# Patient Record
Sex: Female | Born: 1955 | Race: Black or African American | Hispanic: No | Marital: Married | Smoking: Never smoker
Health system: Southern US, Community
[De-identification: ages and names within clinical notes are randomized; demographics above are authoritative.]

## PROBLEM LIST (undated history)

## (undated) DIAGNOSIS — M1990 Unspecified osteoarthritis, unspecified site: Secondary | ICD-10-CM

## (undated) DIAGNOSIS — M8580 Other specified disorders of bone density and structure, unspecified site: Secondary | ICD-10-CM

## (undated) DIAGNOSIS — I10 Essential (primary) hypertension: Secondary | ICD-10-CM

## (undated) HISTORY — PX: VAGINAL HYSTERECTOMY: SUR661

## (undated) HISTORY — PX: ECTOPIC PREGNANCY SURGERY: SHX613

## (undated) HISTORY — DX: Essential (primary) hypertension: I10

## (undated) HISTORY — PX: TUBAL LIGATION: SHX77

## (undated) HISTORY — DX: Other specified disorders of bone density and structure, unspecified site: M85.80

## (undated) HISTORY — PX: TOTAL HIP ARTHROPLASTY: SHX124

---

## 1997-10-15 ENCOUNTER — Observation Stay (HOSPITAL_COMMUNITY): Admission: RE | Admit: 1997-10-15 | Discharge: 1997-10-16 | Payer: Self-pay | Admitting: Gynecology

## 2001-10-03 ENCOUNTER — Encounter: Admission: RE | Admit: 2001-10-03 | Discharge: 2001-10-03 | Payer: Self-pay | Admitting: Otolaryngology

## 2001-10-03 ENCOUNTER — Encounter: Payer: Self-pay | Admitting: Otolaryngology

## 2003-12-23 ENCOUNTER — Other Ambulatory Visit: Admission: RE | Admit: 2003-12-23 | Discharge: 2003-12-23 | Payer: Self-pay | Admitting: Gynecology

## 2004-12-31 ENCOUNTER — Other Ambulatory Visit: Admission: RE | Admit: 2004-12-31 | Discharge: 2004-12-31 | Payer: Self-pay | Admitting: Gynecology

## 2006-02-01 ENCOUNTER — Other Ambulatory Visit: Admission: RE | Admit: 2006-02-01 | Discharge: 2006-02-01 | Payer: Self-pay | Admitting: Gynecology

## 2007-02-07 ENCOUNTER — Other Ambulatory Visit: Admission: RE | Admit: 2007-02-07 | Discharge: 2007-02-07 | Payer: Self-pay | Admitting: Gynecology

## 2007-08-13 ENCOUNTER — Encounter: Admission: RE | Admit: 2007-08-13 | Discharge: 2007-08-13 | Payer: Self-pay | Admitting: Gynecology

## 2008-01-21 ENCOUNTER — Ambulatory Visit: Payer: Self-pay | Admitting: Gynecology

## 2008-03-14 ENCOUNTER — Ambulatory Visit: Payer: Self-pay | Admitting: Obstetrics and Gynecology

## 2008-03-14 ENCOUNTER — Other Ambulatory Visit: Admission: RE | Admit: 2008-03-14 | Discharge: 2008-03-14 | Payer: Self-pay | Admitting: Obstetrics and Gynecology

## 2008-03-14 ENCOUNTER — Encounter: Payer: Self-pay | Admitting: Obstetrics and Gynecology

## 2008-08-21 ENCOUNTER — Ambulatory Visit: Payer: Self-pay | Admitting: Gynecology

## 2008-12-11 ENCOUNTER — Ambulatory Visit: Payer: Self-pay | Admitting: Obstetrics and Gynecology

## 2009-03-16 ENCOUNTER — Encounter: Payer: Self-pay | Admitting: Obstetrics and Gynecology

## 2009-03-16 ENCOUNTER — Ambulatory Visit: Payer: Self-pay | Admitting: Obstetrics and Gynecology

## 2009-03-16 ENCOUNTER — Other Ambulatory Visit: Admission: RE | Admit: 2009-03-16 | Discharge: 2009-03-16 | Payer: Self-pay | Admitting: Obstetrics and Gynecology

## 2009-07-13 ENCOUNTER — Ambulatory Visit: Payer: Self-pay | Admitting: Obstetrics and Gynecology

## 2010-01-18 ENCOUNTER — Ambulatory Visit: Payer: Self-pay | Admitting: Gynecology

## 2010-07-07 ENCOUNTER — Encounter: Payer: Self-pay | Admitting: Obstetrics and Gynecology

## 2010-07-08 ENCOUNTER — Other Ambulatory Visit (HOSPITAL_COMMUNITY)
Admission: RE | Admit: 2010-07-08 | Discharge: 2010-07-08 | Disposition: A | Discharge status: Home / Self Care | Payer: BC Managed Care – PPO | Source: Ambulatory Visit | Attending: Obstetrics and Gynecology | Admitting: Obstetrics and Gynecology

## 2010-07-08 ENCOUNTER — Encounter (INDEPENDENT_AMBULATORY_CARE_PROVIDER_SITE_OTHER): Payer: BC Managed Care – PPO | Admitting: Obstetrics and Gynecology

## 2010-07-08 ENCOUNTER — Other Ambulatory Visit: Payer: Self-pay | Admitting: Obstetrics and Gynecology

## 2010-07-08 DIAGNOSIS — Z01419 Encounter for gynecological examination (general) (routine) without abnormal findings: Secondary | ICD-10-CM

## 2010-07-08 DIAGNOSIS — Z124 Encounter for screening for malignant neoplasm of cervix: Secondary | ICD-10-CM | POA: Insufficient documentation

## 2010-09-27 ENCOUNTER — Ambulatory Visit (HOSPITAL_COMMUNITY)
Admission: RE | Admit: 2010-09-27 | Discharge: 2010-09-27 | Disposition: A | Discharge status: Home / Self Care | Payer: BC Managed Care – PPO | Source: Ambulatory Visit | Attending: Gynecology | Admitting: Gynecology

## 2010-09-27 ENCOUNTER — Ambulatory Visit (INDEPENDENT_AMBULATORY_CARE_PROVIDER_SITE_OTHER): Payer: BC Managed Care – PPO | Admitting: Gynecology

## 2010-09-27 DIAGNOSIS — R350 Frequency of micturition: Secondary | ICD-10-CM

## 2010-09-27 DIAGNOSIS — R82998 Other abnormal findings in urine: Secondary | ICD-10-CM

## 2010-09-27 DIAGNOSIS — R072 Precordial pain: Secondary | ICD-10-CM | POA: Insufficient documentation

## 2010-09-27 DIAGNOSIS — I10 Essential (primary) hypertension: Secondary | ICD-10-CM

## 2010-09-27 DIAGNOSIS — R51 Headache: Secondary | ICD-10-CM

## 2010-10-07 ENCOUNTER — Ambulatory Visit (INDEPENDENT_AMBULATORY_CARE_PROVIDER_SITE_OTHER): Payer: BC Managed Care – PPO | Admitting: Obstetrics and Gynecology

## 2010-10-07 DIAGNOSIS — B3731 Acute candidiasis of vulva and vagina: Secondary | ICD-10-CM

## 2010-10-07 DIAGNOSIS — B373 Candidiasis of vulva and vagina: Secondary | ICD-10-CM

## 2010-10-07 DIAGNOSIS — N390 Urinary tract infection, site not specified: Secondary | ICD-10-CM

## 2011-04-27 ENCOUNTER — Other Ambulatory Visit: Payer: Self-pay | Admitting: *Deleted

## 2011-04-27 MED ORDER — NONFORMULARY OR COMPOUNDED ITEM: Status: DC

## 2011-04-27 NOTE — Telephone Encounter (Signed)
rx sent

## 2011-09-02 ENCOUNTER — Other Ambulatory Visit: Payer: Self-pay | Admitting: Obstetrics and Gynecology

## 2012-01-16 ENCOUNTER — Encounter: Payer: Self-pay | Admitting: Obstetrics and Gynecology

## 2012-03-13 ENCOUNTER — Other Ambulatory Visit: Payer: Self-pay | Admitting: Obstetrics and Gynecology

## 2012-04-09 ENCOUNTER — Ambulatory Visit (INDEPENDENT_AMBULATORY_CARE_PROVIDER_SITE_OTHER): Payer: BC Managed Care – PPO | Admitting: Gynecology

## 2012-04-09 ENCOUNTER — Encounter: Payer: Self-pay | Admitting: Gynecology

## 2012-04-09 VITALS — BP 142/88

## 2012-04-09 DIAGNOSIS — N760 Acute vaginitis: Secondary | ICD-10-CM

## 2012-04-09 DIAGNOSIS — B9689 Other specified bacterial agents as the cause of diseases classified elsewhere: Secondary | ICD-10-CM

## 2012-04-09 DIAGNOSIS — R300 Dysuria: Secondary | ICD-10-CM

## 2012-04-09 DIAGNOSIS — A499 Bacterial infection, unspecified: Secondary | ICD-10-CM

## 2012-04-09 DIAGNOSIS — L293 Anogenital pruritus, unspecified: Secondary | ICD-10-CM

## 2012-04-09 DIAGNOSIS — N898 Other specified noninflammatory disorders of vagina: Secondary | ICD-10-CM

## 2012-04-09 LAB — URINALYSIS W MICROSCOPIC + REFLEX CULTURE
Bilirubin Urine: NEGATIVE
Glucose, UA: NEGATIVE mg/dL
Specific Gravity, Urine: 1.025 (ref 1.005–1.030)
Urobilinogen, UA: 0.2 mg/dL (ref 0.0–1.0)
pH: 6 (ref 5.0–8.0)

## 2012-04-09 MED ORDER — METRONIDAZOLE 0.75 % VA GEL: 1.0000 | Freq: Two times a day (BID) | VAGINAL | Status: DC

## 2012-04-09 MED ORDER — FLUCONAZOLE 100 MG PO TABS: 100.0000 mg | ORAL_TABLET | Freq: Every day | ORAL | Status: DC

## 2012-04-09 NOTE — Progress Notes (Signed)
Patient presented to the office today complaining of vaginal pruritus and some slight discomfort after the termination of her urination but no true dysuria. Patient with prior history transvaginal hysterectomy. Patient stated in June of this year she had a right hip replacement in the next few weeks she is going to have a left hip for placement. Because of the prosthesis she was also informed to take antibiotics before the procedure. She recently had dental work had taken antibiotics and she attributes which she thought was a yeast infection and had tried Diflucan that she had at home as well as  a 1 day treatment with Monistat vaginal cream with some resolution.  Exam: Bartholin urethra Skene was within normal limits Vagina white fishy odor discharge was noted vaginal cuff intact and Bimanual exam: Not done Rectal exam: Not done  Urinalysis negative Wet prep: A few yeast, moderate clue cells and many white blood cells and too numerous to count bacteria.  Assessment/plan; monilial vulvovaginitis and bacterial vaginosis,  the patient will be prescribed  MetroGel to apply twice a day for the next 5 days. Diflucan 150 mg one today and a repeat in 48 hours.

## 2012-04-09 NOTE — Patient Instructions (Signed)
Bacterial Vaginosis Bacterial vaginosis (BV) is a vaginal infection where the normal balance of bacteria in the vagina is disrupted. The normal balance is then replaced by an overgrowth of certain bacteria. There are several different kinds of bacteria that can cause BV. BV is the most common vaginal infection in women of childbearing age. CAUSES   The cause of BV is not fully understood. BV develops when there is an increase or imbalance of harmful bacteria.  Some activities or behaviors can upset the normal balance of bacteria in the vagina and put women at increased risk including:  Having a new sex partner or multiple sex partners.  Douching.  Using an intrauterine device (IUD) for contraception.  It is not clear what role sexual activity plays in the development of BV. However, women that have never had sexual intercourse are rarely infected with BV. Women do not get BV from toilet seats, bedding, swimming pools or from touching objects around them.  SYMPTOMS   Grey vaginal discharge.  A fish-like odor with discharge, especially after sexual intercourse.  Itching or burning of the vagina and vulva.  Burning or pain with urination.  Some women have no signs or symptoms at all. DIAGNOSIS  Your caregiver must examine the vagina for signs of BV. Your caregiver will perform lab tests and look at the sample of vaginal fluid through a microscope. They will look for bacteria and abnormal cells (clue cells), a pH test higher than 4.5, and a positive amine test all associated with BV.  RISKS AND COMPLICATIONS   Pelvic inflammatory disease (PID).  Infections following gynecology surgery.  Developing HIV.  Developing herpes virus. TREATMENT  Sometimes BV will clear up without treatment. However, all women with symptoms of BV should be treated to avoid complications, especially if gynecology surgery is planned. Female partners generally do not need to be treated. However, BV may spread  between female sex partners so treatment is helpful in preventing a recurrence of BV.   BV may be treated with antibiotics. The antibiotics come in either pill or vaginal cream forms. Either can be used with nonpregnant or pregnant women, but the recommended dosages differ. These antibiotics are not harmful to the baby.  BV can recur after treatment. If this happens, a second round of antibiotics will often be prescribed.  Treatment is important for pregnant women. If not treated, BV can cause a premature delivery, especially for a pregnant woman who had a premature birth in the past. All pregnant women who have symptoms of BV should be checked and treated.  For chronic reoccurrence of BV, treatment with a type of prescribed gel vaginally twice a week is helpful. HOME CARE INSTRUCTIONS   Finish all medication as directed by your caregiver.  Do not have sex until treatment is completed.  Tell your sexual partner that you have a vaginal infection. They should see their caregiver and be treated if they have problems, such as a mild rash or itching.  Practice safe sex. Use condoms. Only have 1 sex partner. PREVENTION  Basic prevention steps can help reduce the risk of upsetting the natural balance of bacteria in the vagina and developing BV:  Do not have sexual intercourse (be abstinent).  Do not douche.  Use all of the medicine prescribed for treatment of BV, even if the signs and symptoms go away.  Tell your sex partner if you have BV. That way, they can be treated, if needed, to prevent reoccurrence. SEEK MEDICAL CARE IF:     Your symptoms are not improving after 3 days of treatment.  You have increased discharge, pain, or fever. MAKE SURE YOU:   Understand these instructions.  Will watch your condition.  Will get help right away if you are not doing well or get worse. FOR MORE INFORMATION  Division of STD Prevention (DSTDP), Centers for Disease Control and Prevention:  www.cdc.gov/std American Social Health Association (ASHA): www.ashastd.org  Document Released: 04/25/2005 Document Revised: 07/18/2011 Document Reviewed: 10/16/2008 ExitCare Patient Information 2013 [employer].  

## 2012-04-17 ENCOUNTER — Telehealth: Payer: Self-pay | Admitting: *Deleted

## 2012-04-17 NOTE — Telephone Encounter (Signed)
Please call in Flagyl 500 mg to take 1 tablet twice a day for 5 days #10. If the Bartholin cyst is causing her problems she needs to be seen in the office for evaluation and treatment.

## 2012-04-17 NOTE — Telephone Encounter (Signed)
Pt was given 04/09/12 given rx for BV infection MetroGel to apply twice a day for the next 5 days she didn't get to complete the medication. She developed a bartholin cyst outside her vagina and would like something oral to take by mouth. She only had 3 days left of the metrogel to take prior to the cyst. Please advise

## 2012-04-18 MED ORDER — METRONIDAZOLE 500 MG PO TABS: 500.0000 mg | ORAL_TABLET | Freq: Two times a day (BID) | ORAL | Status: DC

## 2012-04-18 NOTE — Telephone Encounter (Signed)
rx sent, pt informed with the below 

## 2012-04-23 ENCOUNTER — Encounter: Payer: Self-pay | Admitting: Obstetrics and Gynecology

## 2012-04-23 ENCOUNTER — Ambulatory Visit (INDEPENDENT_AMBULATORY_CARE_PROVIDER_SITE_OTHER): Payer: BC Managed Care – PPO | Admitting: Obstetrics and Gynecology

## 2012-04-23 VITALS — BP 124/76 | Ht 68.0 in | Wt 202.0 lb

## 2012-04-23 DIAGNOSIS — I10 Essential (primary) hypertension: Secondary | ICD-10-CM | POA: Insufficient documentation

## 2012-04-23 DIAGNOSIS — Z01419 Encounter for gynecological examination (general) (routine) without abnormal findings: Secondary | ICD-10-CM

## 2012-04-23 DIAGNOSIS — N898 Other specified noninflammatory disorders of vagina: Secondary | ICD-10-CM

## 2012-04-23 LAB — WET PREP FOR TRICH, YEAST, CLUE
Trich, Wet Prep: NONE SEEN
Yeast Wet Prep HPF POC: NONE SEEN

## 2012-04-23 MED ORDER — TERCONAZOLE 0.8 % VA CREA: 1.0000 | TOPICAL_CREAM | Freq: Every day | VAGINAL | Status: DC

## 2012-04-23 NOTE — Patient Instructions (Addendum)
Call Cancer center at [facility]. Get counseling for BRCA1 and BRCA2 because of sister and aunt's history. Schedule  bone density.

## 2012-04-23 NOTE — Progress Notes (Signed)
Patient came to see me today for her annual GYN exam. We are treating her with Depo- estradiol and testosterone cream for menopausal symptoms that did not respond to other regimens. She is doing well with both and would like to continue. She is having no vaginal bleeding. She is having no pelvic pain. She had a vaginal hysterectomy for benign disease. In her early 98s she had cervical dysplasia treated with cryosurgery. She has had normal Pap smears since then.Her last Pap smear was 2012. She is up-to-date on mammograms.She has never had a bone density. She does her lab through her PCP. She is noticing a persistent cottage cheesy discharge with itching. She is required several rounds of antibiotics. She is for Dr. Lily Peer last month and was treated for BV and yeast with MetroGel and Diflucan. She repeated Diflucan but is still symptomatic. She also had a labial cyst rupture on her right labia and is draining purulent material. It is much better. Both sister and maternal aunt had breast cancer at age 67. We have discussed BRCA1 and BRCA2 testing last year but the patient has not followed through. HEENT: Within normal limits.Kennon Portela present Neck: No masses. Supraclavicular lymph nodes: Not enlarged. Breasts: Examined in both sitting and lying position. Symmetrical without skin changes or masses. Abdomen: Soft no masses guarding or rebound. No hernias. Pelvic: External Shows area of folliculitis on right labia almost completely resolved and without cellulitis. BUS within normal limits. Vaginal examination shows good estrogen effect, no cystocele enterocele or rectocele.Wet prep is negative for yeast and Trichomonas but patient has a yeast like discharge. Cervix and uterus absent. Adnexa within normal limits. Rectal-vaginal exam is confirmatory  Assessment: #1. Menopausal symptoms #2. Resolving  folliculitis #3. Yeast vaginitis #4. Family history of early onset breast cancer  Plan: Continued  depo-estradiol (1mg /cc)-inject 1 cc monthly. Testosterone cream 2%-continue when necessary. Mammogram yearly. Schedule bone density. Pap not done.The new Pap smear guidelines were discussed with the patient. Patient to get genetic testing at cancer Center for BRCA1 and BRCA2. Terconazole 3 cream for yeast vaginitis. Call if folliculitis does not completely heal-continue warm soaks.

## 2012-05-08 ENCOUNTER — Other Ambulatory Visit: Payer: Self-pay | Admitting: Gynecology

## 2012-05-08 NOTE — Telephone Encounter (Signed)
Pt saw you Dr Reece Agar for her annual exam 04/23/12 and was treated with terazol 3.

## 2013-04-30 ENCOUNTER — Ambulatory Visit (INDEPENDENT_AMBULATORY_CARE_PROVIDER_SITE_OTHER): Payer: BC Managed Care – PPO | Admitting: Women's Health

## 2013-04-30 DIAGNOSIS — B3731 Acute candidiasis of vulva and vagina: Secondary | ICD-10-CM

## 2013-04-30 DIAGNOSIS — B373 Candidiasis of vulva and vagina: Secondary | ICD-10-CM

## 2013-04-30 DIAGNOSIS — R3915 Urgency of urination: Secondary | ICD-10-CM

## 2013-04-30 LAB — WET PREP FOR TRICH, YEAST, CLUE

## 2013-04-30 LAB — URINALYSIS, ROUTINE W REFLEX MICROSCOPIC
Hgb urine dipstick: NEGATIVE
Leukocytes, UA: NEGATIVE
Nitrite: NEGATIVE
Protein, ur: NEGATIVE mg/dL

## 2013-04-30 MED ORDER — FLUCONAZOLE 100 MG PO TABS: ORAL_TABLET | ORAL | Status: DC

## 2013-04-30 NOTE — Addendum Note (Signed)
Addended by: Bertram Savin A on: 04/30/2013 02:57 PM   Modules accepted: Orders

## 2013-04-30 NOTE — Progress Notes (Signed)
Patient ID: Deborah Hampton, female   DOB: 04/03/1956, 57 y.o.   MRN: [id number] Presents with vaginal discharge with mild itching, occasional urinary urgency. Denies any pain or burning with urination. Same partner. Hysterectomy. Reports Diflucan works best but needs to take longer than once.  Exam: Appears well. External genitalia mild erythema. Speculum exam scant white discharge wet prep positive for yeast. Bimanual no  tenderness. UA: Trace blood, 0 - 2 RBCs, no bacteria.  Yeast vaginitis  Plan: Diflucan 100 2 tablets today and then 1 tablet weekly for 3 weeks. Prescription, proper use given and reviewed. Instructed to call if no relief of symptoms.

## 2013-09-13 ENCOUNTER — Encounter: Payer: Self-pay | Admitting: Women's Health

## 2013-09-13 ENCOUNTER — Ambulatory Visit (INDEPENDENT_AMBULATORY_CARE_PROVIDER_SITE_OTHER): Payer: BC Managed Care – PPO | Admitting: Women's Health

## 2013-09-13 VITALS — BP 110/76 | Ht 68.0 in | Wt 207.0 lb

## 2013-09-13 DIAGNOSIS — Z01419 Encounter for gynecological examination (general) (routine) without abnormal findings: Secondary | ICD-10-CM

## 2013-09-13 NOTE — Patient Instructions (Signed)
Health Recommendations for Postmenopausal Women Respected and ongoing research has looked at the most common causes of death, disability, and poor quality of life in postmenopausal women. The causes include heart disease, diseases of blood vessels, diabetes, depression, cancer, and bone loss (osteoporosis). Many things can be done to help lower the chances of developing these and other common problems: CARDIOVASCULAR DISEASE Heart Disease: A heart attack is a medical emergency. Know the signs and symptoms of a heart attack. Below are things women can do to reduce their risk for heart disease.   Do not smoke. If you smoke, quit.  Aim for a healthy weight. Being overweight causes many preventable deaths. Eat a healthy and balanced diet and drink an adequate amount of liquids.  Get moving. Make a commitment to be more physically active. Aim for 30 minutes of activity on most, if not all days of the week.  Eat for heart health. Choose a diet that is low in saturated fat and cholesterol and eliminate trans fat. Include whole grains, vegetables, and fruits. Read and understand the labels on food containers before buying.  Know your numbers. Ask your caregiver to check your blood pressure, cholesterol (total, HDL, LDL, triglycerides) and blood glucose. Work with your caregiver on improving your entire clinical picture.  High blood pressure. Limit or stop your table salt intake (try salt substitute and food seasonings). Avoid salty foods and drinks. Read labels on food containers before buying. Eating well and exercising can help control high blood pressure. STROKE  Stroke is a medical emergency. Stroke may be the result of a blood clot in a blood vessel in the brain or by a brain hemorrhage (bleeding). Know the signs and symptoms of a stroke. To lower the risk of developing a stroke:  Avoid fatty foods.  Quit smoking.  Control your diabetes, blood pressure, and irregular heart rate. THROMBOPHLEBITIS  (BLOOD CLOT) OF THE LEG  Becoming overweight and leading a stationary lifestyle may also contribute to developing blood clots. Controlling your diet and exercising will help lower the risk of developing blood clots. CANCER SCREENING  Breast Cancer: Take steps to reduce your risk of breast cancer.  You should practice "breast self-awareness." This means understanding the normal appearance and feel of your breasts and should include breast self-examination. Any changes detected, no matter how small, should be reported to your caregiver.  After age 40, you should have a clinical breast exam (CBE) every year.  Starting at age 40, you should consider having a mammogram (breast X-ray) every year.  If you have a family history of breast cancer, talk to your caregiver about genetic screening.  If you are at high risk for breast cancer, talk to your caregiver about having an MRI and a mammogram every year.  Intestinal or Stomach Cancer: Tests to consider are a rectal exam, fecal occult blood, sigmoidoscopy, and colonoscopy. Women who are high risk may need to be screened at an earlier age and more often.  Cervical Cancer:  Beginning at age 30, you should have a Pap test every 3 years as long as the past 3 Pap tests have been normal.  If you have had past treatment for cervical cancer or a condition that could lead to cancer, you need Pap tests and screening for cancer for at least 20 years after your treatment.  If you had a hysterectomy for a problem that was not cancer or a condition that could lead to cancer, then you no longer need Pap tests.    If you are between ages 65 and 70, and you have had normal Pap tests going back 10 years, you no longer need Pap tests.  If Pap tests have been discontinued, risk factors (such as a new sexual partner) need to be reassessed to determine if screening should be resumed.  Some medical problems can increase the chance of getting cervical cancer. In these  cases, your caregiver may recommend more frequent screening and Pap tests.  Uterine Cancer: If you have vaginal bleeding after reaching menopause, you should notify your caregiver.  Ovarian cancer: Other than yearly pelvic exams, there are no reliable tests available to screen for ovarian cancer at this time except for yearly pelvic exams.  Lung Cancer: Yearly chest X-rays can detect lung cancer and should be done on high risk women, such as cigarette smokers and women with chronic lung disease (emphysema).  Skin Cancer: A complete body skin exam should be done at your yearly examination. Avoid overexposure to the sun and ultraviolet light lamps. Use a strong sun block cream when in the sun. All of these things are important in lowering the risk of skin cancer. MENOPAUSE Menopause Symptoms: Hormone therapy products are effective for treating symptoms associated with menopause:  Moderate to severe hot flashes.  Night sweats.  Mood swings.  Headaches.  Tiredness.  Loss of sex drive.  Insomnia.  Other symptoms. Hormone replacement carries certain risks, especially in older women. Women who use or are thinking about using estrogen or estrogen with progestin treatments should discuss that with their caregiver. Your caregiver will help you understand the benefits and risks. The ideal dose of hormone replacement therapy is not known. The Food and Drug Administration (FDA) has concluded that hormone therapy should be used only at the lowest doses and for the shortest amount of time to reach treatment goals.  OSTEOPOROSIS Protecting Against Bone Loss and Preventing Fracture: If you use hormone therapy for prevention of bone loss (osteoporosis), the risks for bone loss must outweigh the risk of the therapy. Ask your caregiver about other medications known to be safe and effective for preventing bone loss and fractures. To guard against bone loss or fractures, the following is recommended:  If  you are less than age 50, take 1000 mg of calcium and at least 600 mg of Vitamin D per day.  If you are greater than age 50 but less than age 70, take 1200 mg of calcium and at least 600 mg of Vitamin D per day.  If you are greater than age 70, take 1200 mg of calcium and at least 800 mg of Vitamin D per day. Smoking and excessive alcohol intake increases the risk of osteoporosis. Eat foods rich in calcium and vitamin D and do weight bearing exercises several times a week as your caregiver suggests. DIABETES Diabetes Melitus: If you have Type I or Type 2 diabetes, you should keep your blood sugar under control with diet, exercise and recommended medication. Avoid too many sweets, starchy and fatty foods. Being overweight can make control more difficult. COGNITION AND MEMORY Cognition and Memory: Menopausal hormone therapy is not recommended for the prevention of cognitive disorders such as Alzheimer's disease or memory loss.  DEPRESSION  Depression may occur at any age, but is common in elderly women. The reasons may be because of physical, medical, social (loneliness), or financial problems and needs. If you are experiencing depression because of medical problems and control of symptoms, talk to your caregiver about this. Physical activity and   exercise may help with mood and sleep. Community and volunteer involvement may help your sense of value and worth. If you have depression and you feel that the problem is getting worse or becoming severe, talk to your caregiver about treatment options that are best for you. ACCIDENTS  Accidents are common and can be serious in the elderly woman. Prepare your house to prevent accidents. Eliminate throw rugs, place hand bars in the bath, shower and toilet areas. Avoid wearing high heeled shoes or walking on wet, snowy, and icy areas. Limit or stop driving if you have vision or hearing problems, or you feel you are unsteady with you movements and  reflexes. HEPATITIS C Hepatitis C is a type of viral infection affecting the liver. It is spread mainly through contact with blood from an infected person. It can be treated, but if left untreated, it can lead to severe liver damage over years. Many people who are infected do not know that the virus is in their blood. If you are a "baby-boomer", it is recommended that you have one screening test for Hepatitis C. IMMUNIZATIONS  Several immunizations are important to consider having during your senior years, including:   Tetanus, diptheria, and pertussis booster shot.  Influenza every year before the flu season begins.  Pneumonia vaccine.  Shingles vaccine.  Others as indicated based on your specific needs. Talk to your caregiver about these. Document Released: 06/17/2005 Document Revised: 04/11/2012 Document Reviewed: 02/11/2008 ExitCare Patient Information 2014 [employer].  

## 2013-09-13 NOTE — Progress Notes (Signed)
Deborah Hampton 1955-09-06 [id number]    History:    Presents for annual exam.  TVH for endometriosis, testosterone cream occasionally. Hypertension managed by primary care. History of abnormal Pap in her 11s with cryo-  normal Paps after. Normal mammogram history. Sister died from breast cancer BRCA status unknown, has not had BRCA testing. 2012 negative colonoscopy. Has not had a DEXA.  Past medical history, past surgical history, family history and social history were all reviewed and documented in the EPIC chart. Self-employed and rentals. Mother, father, sister hypertension, father diabetes. Children are 19 and 28 both doing well.  ROS:  A  12 point ROS was performed and pertinent positives and negatives are included.  Exam:  Filed Vitals:   09/13/13 1024  BP: 110/76    General appearance:  Normal Thyroid:  Symmetrical, normal in size, without palpable masses or nodularity. Respiratory  Auscultation:  Clear without wheezing or rhonchi Cardiovascular  Auscultation:  Regular rate, without rubs, murmurs or gallops  Edema/varicosities:  Not grossly evident Abdominal  Soft,nontender, without masses, guarding or rebound.  Liver/spleen:  No organomegaly noted  Hernia:  None appreciated  Skin  Inspection:  Grossly normal   Breasts: Examined lying and sitting.     Right: Without masses, retractions, discharge or axillary adenopathy.     Left: Without masses, retractions, discharge or axillary adenopathy. Gentitourinary   Inguinal/mons:  Normal without inguinal adenopathy  External genitalia:  Normal  BUS/Urethra/Skene's glands:  Normal  Vagina:  Normal  Cervix: Absent  Uterus:  Absent  Adnexa/parametria:     Rt: Without masses or tenderness.   Lt: Without masses or tenderness.  Anus and perineum: Normal  Digital rectal exam: Normal sphincter tone without palpated masses or tenderness  Assessment/Plan:  58 y.o. WBF G2P2  for annual exam.     TVH for  endometriosis Testosterone cream Hypertension primary care manages labs and meds  Plan: SBE's, continue annual mammogram, BRCA testing reviewed, declines. Increase regular exercise, calcium rich diet, vitamin D 2000 daily encouraged. DEXA, encourage, states not covered with insurance. Reviewed importance of weight bearing exercises for diabetes prevention and then health. Testosterone 2% cream uses occasionally, less than weekly, prescription, proper use given and reviewed risks.   [place], 11:18 AM 09/13/2013

## 2013-10-17 ENCOUNTER — Ambulatory Visit (INDEPENDENT_AMBULATORY_CARE_PROVIDER_SITE_OTHER): Payer: BC Managed Care – PPO | Admitting: Women's Health

## 2013-10-17 ENCOUNTER — Encounter: Payer: Self-pay | Admitting: Women's Health

## 2013-10-17 DIAGNOSIS — N644 Mastodynia: Secondary | ICD-10-CM

## 2013-10-17 NOTE — Progress Notes (Signed)
Patient ID: Deborah Hampton, female   DOB: January 30, 1956, 58 y.o.   MRN: [id number]  Presents with complaint of itermittent left-sided breast pain for 3 days. Weight bearing exercise on Monday and Tuesday, notes pain increased following work-out. Reproducible with arm movement. Located along lateral aspect of breast and around to back. Pain is intermittent and has improved since Monday. Last mammogram 2015- normal. Sister age 21 breast cancer.  Exam: well appearing Breasts: Examined lying and sitting.  Right: Without masses, retractions, discharge or axillary adenopathy.  Left: Without masses, retractions, discharge or axillary adenopathy. Pain reproducible with palpation of lateral chest wall.   Mastodynia likely musculoskeletal  Plan: Ibuprofen and rest from heavy lifting recommended. Instructed to call if symptoms worsen or do not improve in 2 weeks, will proceed with ultrasound with diagnostic mammogram.

## 2014-01-24 ENCOUNTER — Ambulatory Visit (INDEPENDENT_AMBULATORY_CARE_PROVIDER_SITE_OTHER): Payer: BC Managed Care – PPO | Admitting: Women's Health

## 2014-01-24 ENCOUNTER — Encounter: Payer: Self-pay | Admitting: Women's Health

## 2014-01-24 DIAGNOSIS — N898 Other specified noninflammatory disorders of vagina: Secondary | ICD-10-CM

## 2014-01-24 DIAGNOSIS — B373 Candidiasis of vulva and vagina: Secondary | ICD-10-CM

## 2014-01-24 DIAGNOSIS — B3731 Acute candidiasis of vulva and vagina: Secondary | ICD-10-CM

## 2014-01-24 LAB — WET PREP FOR TRICH, YEAST, CLUE
Clue Cells Wet Prep HPF POC: NONE SEEN
TRICH WET PREP: NONE SEEN
YEAST WET PREP: NONE SEEN

## 2014-01-24 MED ORDER — FLUCONAZOLE 100 MG PO TABS: ORAL_TABLET | ORAL | Status: DC

## 2014-01-24 NOTE — Progress Notes (Signed)
Presents with complaints of white, chunky vaginal discharge and itching.  Started approximately two weeks ago after being treated with erythromycin by PCP.  Took diflucan that was left over and treated with OTC monistat.  Symptoms diminished but became more severe 2 days ago.  Denies any odor, dysuria, abd pain, back pain, urinary symptoms, fever, nausea or vomiting.  History of recurrent yeast. TVH no HRT.  Exam: Well-appearing.  External genital exam:  WNL.  Speculum exam: thick, chunky white discharge present, mild vaginal wall erythema.  Wet Prep: WNL.   Clinical Yeast infection  Plan:  Diflucan 200 for 1 dose.  Repeat in 3 days if needed.  #6.  Return if symptoms do not resolve or new symptoms develop.

## 2014-01-24 NOTE — Patient Instructions (Signed)

## 2014-01-31 ENCOUNTER — Other Ambulatory Visit: Payer: Self-pay

## 2014-01-31 DIAGNOSIS — Z1231 Encounter for screening mammogram for malignant neoplasm of breast: Secondary | ICD-10-CM

## 2014-02-07 ENCOUNTER — Ambulatory Visit
Admission: RE | Admit: 2014-02-07 | Discharge: 2014-02-07 | Disposition: A | Discharge status: Home / Self Care | Payer: BC Managed Care – PPO | Source: Ambulatory Visit

## 2014-02-07 DIAGNOSIS — Z1231 Encounter for screening mammogram for malignant neoplasm of breast: Secondary | ICD-10-CM

## 2014-03-10 ENCOUNTER — Encounter: Payer: Self-pay | Admitting: Women's Health

## 2014-07-21 ENCOUNTER — Telehealth: Payer: Self-pay | Admitting: *Deleted

## 2014-07-21 ENCOUNTER — Other Ambulatory Visit: Payer: Self-pay

## 2014-07-21 MED ORDER — FLUCONAZOLE 100 MG PO TABS: ORAL_TABLET | ORAL | Status: DC

## 2014-07-21 NOTE — Telephone Encounter (Signed)
Okay, diflucan 100, 2 tablets today and repeat if needed in 3 days, # 4  hx of recurrent yeast.

## 2014-07-21 NOTE — Telephone Encounter (Signed)
Pt informed, Rx sent. 

## 2014-07-21 NOTE — Telephone Encounter (Signed)
Pt called c/o yeast infection due to having dental work done about 1 week ago. Pt asked if you would be willing to refill Rx? Please advise

## 2014-07-28 ENCOUNTER — Encounter: Payer: Self-pay | Admitting: Gynecology

## 2014-07-28 ENCOUNTER — Ambulatory Visit (INDEPENDENT_AMBULATORY_CARE_PROVIDER_SITE_OTHER): Payer: BLUE CROSS/BLUE SHIELD | Admitting: Gynecology

## 2014-07-28 VITALS — BP 124/76

## 2014-07-28 DIAGNOSIS — N898 Other specified noninflammatory disorders of vagina: Secondary | ICD-10-CM | POA: Diagnosis not present

## 2014-07-28 DIAGNOSIS — N760 Acute vaginitis: Secondary | ICD-10-CM | POA: Diagnosis not present

## 2014-07-28 DIAGNOSIS — A499 Bacterial infection, unspecified: Secondary | ICD-10-CM | POA: Diagnosis not present

## 2014-07-28 DIAGNOSIS — B9689 Other specified bacterial agents as the cause of diseases classified elsewhere: Secondary | ICD-10-CM

## 2014-07-28 LAB — WET PREP FOR TRICH, YEAST, CLUE
Trich, Wet Prep: NONE SEEN
YEAST WET PREP: NONE SEEN

## 2014-07-28 MED ORDER — METRONIDAZOLE 500 MG PO TABS: 500.0000 mg | ORAL_TABLET | Freq: Two times a day (BID) | ORAL | Status: DC

## 2014-07-28 NOTE — Progress Notes (Signed)
Deborah Hampton 03-26-1956 [id number]        59 y.o.  [id number] With one half weeks of vaginal discharge and irritation. Followed antibiotics. Called and was treated with Diflucan 1 after Monistat OTC failed. Symptoms have persisted despite this treatment. No vaginal odor. No urinary symptoms such as frequency dysuria or urgency. No low back pain, fever chills nausea vomiting diarrhea constipation.  Past medical history,surgical history, problem list, medications, allergies, family history and social history were all reviewed and documented in the EPIC chart.  Directed ROS with pertinent positives and negatives documented in the history of present illness/assessment and plan.  Exam: Kim assistant Filed Vitals:   07/28/14 1006  BP: 124/76   General appearance:  Normal Abdomen soft nontender without masses guarding rebound Pelvic external BUS vagina with slight white discharge. Bimanual without masses or tenderness.  Assessment/Plan:  59 y.o. [id number] with above symptoms and exam. Wet prep does show a few clue cells. No yeast or amine. Will cover his low-grade bacterial vaginosis with Flagyl 500 mg twice a day 7days, alcohol avoidance reviewed. Follow up if symptoms persist, worsen or recur.    Dara LordsFONTAINE,Elfida Shimada P MD, 10:29 AM 07/28/2014

## 2014-07-28 NOTE — Patient Instructions (Signed)
Take antibiotic pill twice daily for 7 days. Avoid alcohol while taking.  Call if symptoms persist, worsen or recur.  Bacterial Vaginosis Bacterial vaginosis is a vaginal infection that occurs when the normal balance of bacteria in the vagina is disrupted. It results from an overgrowth of certain bacteria. This is the most common vaginal infection in women of childbearing age. Treatment is important to prevent complications, especially in pregnant women, as it can cause a premature delivery. CAUSES  Bacterial vaginosis is caused by an increase in harmful bacteria that are normally present in smaller amounts in the vagina. Several different kinds of bacteria can cause bacterial vaginosis. However, the reason that the condition develops is not fully understood. RISK FACTORS Certain activities or behaviors can put you at an increased risk of developing bacterial vaginosis, including:  Having a new sex partner or multiple sex partners.  Douching.  Using an intrauterine device (IUD) for contraception. Women do not get bacterial vaginosis from toilet seats, bedding, swimming pools, or contact with objects around them. SIGNS AND SYMPTOMS  Some women with bacterial vaginosis have no signs or symptoms. Common symptoms include:  Grey vaginal discharge.  A fishlike odor with discharge, especially after sexual intercourse.  Itching or burning of the vagina and vulva.  Burning or pain with urination. DIAGNOSIS  Your health care provider will take a medical history and examine the vagina for signs of bacterial vaginosis. A sample of vaginal fluid may be taken. Your health care provider will look at this sample under a microscope to check for bacteria and abnormal cells. A vaginal pH test may also be done.  TREATMENT  Bacterial vaginosis may be treated with antibiotic medicines. These may be given in the form of a pill or a vaginal cream. A second round of antibiotics may be prescribed if the  condition comes back after treatment.  HOME CARE INSTRUCTIONS   Only take over-the-counter or prescription medicines as directed by your health care provider.  If antibiotic medicine was prescribed, take it as directed. Make sure you finish it even if you start to feel better.  Do not have sex until treatment is completed.  Tell all sexual partners that you have a vaginal infection. They should see their health care provider and be treated if they have problems, such as a mild rash or itching.  Practice safe sex by using condoms and only having one sex partner. SEEK MEDICAL CARE IF:   Your symptoms are not improving after 3 days of treatment.  You have increased discharge or pain.  You have a fever. MAKE SURE YOU:   Understand these instructions.  Will watch your condition.  Will get help right away if you are not doing well or get worse. FOR MORE INFORMATION  Centers for Disease Control and Prevention, Division of STD Prevention: SolutionApps.co.zawww.cdc.gov/std American Sexual Health Association (ASHA): www.ashastd.org  Document Released: 04/25/2005 Document Revised: 02/13/2013 Document Reviewed: 12/05/2012 [facility] Patient Information 2015 [employer] [place]. This information is not intended to replace advice given to you by your health care provider. Make sure you discuss any questions you have with your health care provider.

## 2014-07-29 ENCOUNTER — Ambulatory Visit: Payer: Self-pay | Admitting: Women's Health

## 2014-09-18 ENCOUNTER — Telehealth: Payer: Self-pay | Admitting: *Deleted

## 2014-09-18 ENCOUNTER — Other Ambulatory Visit: Payer: Self-pay | Admitting: Obstetrics and Gynecology

## 2014-09-18 NOTE — Telephone Encounter (Signed)
Pt called requesting Rx estradiol injectable 5mL, pt advised annual due and Rx last filled in 2013, pt transferred to front desk.

## 2014-10-08 ENCOUNTER — Encounter: Payer: Self-pay | Admitting: Women's Health

## 2014-10-08 ENCOUNTER — Other Ambulatory Visit: Payer: Self-pay | Admitting: Gynecology

## 2014-10-08 ENCOUNTER — Ambulatory Visit (INDEPENDENT_AMBULATORY_CARE_PROVIDER_SITE_OTHER): Payer: BLUE CROSS/BLUE SHIELD | Admitting: Women's Health

## 2014-10-08 VITALS — BP 118/80 | Wt 198.0 lb

## 2014-10-08 DIAGNOSIS — Z13820 Encounter for screening for osteoporosis: Secondary | ICD-10-CM

## 2014-10-08 DIAGNOSIS — Z01419 Encounter for gynecological examination (general) (routine) without abnormal findings: Secondary | ICD-10-CM | POA: Diagnosis not present

## 2014-10-08 DIAGNOSIS — N898 Other specified noninflammatory disorders of vagina: Secondary | ICD-10-CM | POA: Diagnosis not present

## 2014-10-08 DIAGNOSIS — B373 Candidiasis of vulva and vagina: Secondary | ICD-10-CM | POA: Diagnosis not present

## 2014-10-08 DIAGNOSIS — B3731 Acute candidiasis of vulva and vagina: Secondary | ICD-10-CM

## 2014-10-08 LAB — WET PREP FOR TRICH, YEAST, CLUE
Clue Cells Wet Prep HPF POC: NONE SEEN
Trich, Wet Prep: NONE SEEN

## 2014-10-08 MED ORDER — FLUCONAZOLE 150 MG PO TABS: 150.0000 mg | ORAL_TABLET | Freq: Once | ORAL | Status: DC

## 2014-10-08 NOTE — Patient Instructions (Signed)

## 2014-10-08 NOTE — Progress Notes (Signed)
Deborah Hampton 1955-09-25 [id number]    History:    Presents for annual exam.  TVH. Cryo-in her 6420s with normal Paps after. Normal mammogram history. Sister died from breast cancer. 2012 negative colonoscopy. Has not had a DEXA. Hypertension primary care manages labs and meds. Uses testosterone cream externally rarely. Recently had homeopathic estrogen pellets placed in buttock. Has lost 10 pounds in the past year with diet and exercise.  Past medical history, past surgical history, family history and social history were all reviewed and documented in the EPIC chart. Has a real estate business. Children ages 5620 and 3929 both doing well. Numerous family members with hypertension, father diabetes.  ROS:  A ROS was performed and pertinent positives and negatives are included.  Exam:  Filed Vitals:   10/08/14 0926  BP: 118/80    General appearance:  Normal Thyroid:  Symmetrical, normal in size, without palpable masses or nodularity. Respiratory  Auscultation:  Clear without wheezing or rhonchi Cardiovascular  Auscultation:  Regular rate, without rubs, murmurs or gallops  Edema/varicosities:  Not grossly evident Abdominal  Soft,nontender, without masses, guarding or rebound.  Liver/spleen:  No organomegaly noted  Hernia:  None appreciated  Skin  Inspection:  Grossly normal   Breasts: Examined lying and sitting.     Right: Without masses, retractions, discharge or axillary adenopathy.     Left: Without masses, retractions, discharge or axillary adenopathy. Gentitourinary   Inguinal/mons:  Normal without inguinal adenopathy  External genitalia:  Normal  BUS/Urethra/Skene's glands:  Normal  Vagina:  Normal  Cervix:  And uterus absent  Adnexa/parametria:     Rt: Without masses or tenderness.   Lt: Without masses or tenderness.  Anus and perineum: Normal  Digital rectal exam: Normal sphincter tone without palpated masses or tenderness  Assessment/Plan:  59 y.o.DBF G2P2  for  annual exam complaint of increased discharge without itching or odor.  Yeast vaginitis TVH using homeopathic estrogen pellets and rare use of external testosterone cream Hypertension primary care manages labs and meds  Plan: Testosterone 2% cream externally uses less than weekly prescription, proper use, risks reviewed. Reviewed best not to use estrogen since has been off for 3 years, risks of blood clots, strokes, breast cancer reviewed. Encouraged over-the-counter vitamin E, soy products. Reviewed anteverted presses, declines. SBE's, annual mammogram, continue 3-D tomography, exercise, calcium rich diet, vitamin D 1000 daily. Has not had a DEXA instructed to schedule. Testosterone 2% cream prescription, proper use given and reviewed slight risks uses rarely. UHarrington Challenger.    Deborah Hampton J WHNP, 10:03 AM 10/08/2014

## 2014-10-09 LAB — URINALYSIS W MICROSCOPIC + REFLEX CULTURE
Bilirubin Urine: NEGATIVE
CASTS: NONE SEEN
Glucose, UA: NEGATIVE mg/dL
Hgb urine dipstick: NEGATIVE
Ketones, ur: NEGATIVE mg/dL
LEUKOCYTES UA: NEGATIVE
NITRITE: NEGATIVE
Protein, ur: NEGATIVE mg/dL
SPECIFIC GRAVITY, URINE: 1.022 (ref 1.005–1.030)
Urobilinogen, UA: 0.2 mg/dL (ref 0.0–1.0)
pH: 5 (ref 5.0–8.0)

## 2014-10-10 LAB — URINE CULTURE

## 2015-02-02 ENCOUNTER — Other Ambulatory Visit: Payer: Self-pay

## 2015-02-02 ENCOUNTER — Ambulatory Visit (INDEPENDENT_AMBULATORY_CARE_PROVIDER_SITE_OTHER): Payer: BLUE CROSS/BLUE SHIELD | Admitting: Gynecology

## 2015-02-02 ENCOUNTER — Encounter: Payer: Self-pay | Admitting: Gynecology

## 2015-02-02 ENCOUNTER — Telehealth: Payer: Self-pay | Admitting: *Deleted

## 2015-02-02 VITALS — BP 124/78

## 2015-02-02 DIAGNOSIS — Z1231 Encounter for screening mammogram for malignant neoplasm of breast: Secondary | ICD-10-CM

## 2015-02-02 DIAGNOSIS — N644 Mastodynia: Secondary | ICD-10-CM | POA: Diagnosis not present

## 2015-02-02 NOTE — Patient Instructions (Signed)
Office will call you to arrange the mammogram and ultrasound of the breast. 

## 2015-02-02 NOTE — Progress Notes (Signed)
Deborah Hampton 1955-12-22 [id number]        59 y.o.  [id number] presents with one-week history of right breast tenderness and questioning whether she feels a lump. Was doing well until her 90 pound dog pulled her on the leash and she had strain/injury to her shoulder. It seems to radiate down into her breast and she was unsure whether she felt a mass or not. No other lumps or tenderness. No nipple discharge. Is due for her mammogram now.  Past medical history,surgical history, problem list, medications, allergies, family history and social history were all reviewed and documented in the EPIC chart.  Directed ROS with pertinent positives and negatives documented in the history of present illness/assessment and plan.  Exam: Kim assistant Filed Vitals:   02/02/15 1209  BP: 124/78   General appearance:  Normal Both breasts examined lying and sitting without masses retractions discharge adenopathy. The area the patient is pointing to is the 2:00 to 3:00 peripheral location along the sternal border. There are no palpable or visual abnormalities.  Assessment/Plan:  59 y.o. [id number] with history and exam as above. Will start with diagnostic mammography/ultrasound of this area. I suspect this is more musculoskeletal and will resolve with heat and pain medication. She is taking extra strength Tylenol now has ibuprofen upsets her stomach. If her shoulder pain continues of recommended follow up with orthopedics.  If her breast discomfort continues despite negative studies then she will follow up with me.    Dara Lords MD, 12:22 PM 02/02/2015

## 2015-02-02 NOTE — Telephone Encounter (Signed)
Orders placed at breast center they will contact you to schedule.

## 2015-02-02 NOTE — Telephone Encounter (Signed)
-----   Message from Dara Lords, MD sent at 02/02/2015 12:24 PM EDT ----- Schedule diagnostic mammography and ultrasound reference new onset right breast tenderness 2 to 3:00 position, periphery of the breast at the sternal border.

## 2015-02-10 NOTE — Telephone Encounter (Signed)
Appointment 02/13/15 @ 1:40pm

## 2015-02-13 ENCOUNTER — Ambulatory Visit
Admission: RE | Admit: 2015-02-13 | Discharge: 2015-02-13 | Disposition: A | Discharge status: Home / Self Care | Payer: BLUE CROSS/BLUE SHIELD | Source: Ambulatory Visit | Attending: Gynecology | Admitting: Gynecology

## 2015-02-13 DIAGNOSIS — N644 Mastodynia: Secondary | ICD-10-CM

## 2015-04-30 ENCOUNTER — Encounter: Payer: Self-pay | Admitting: Women's Health

## 2015-04-30 ENCOUNTER — Ambulatory Visit (INDEPENDENT_AMBULATORY_CARE_PROVIDER_SITE_OTHER): Payer: BLUE CROSS/BLUE SHIELD | Admitting: Women's Health

## 2015-04-30 VITALS — BP 132/80 | Ht 68.0 in | Wt 198.0 lb

## 2015-04-30 DIAGNOSIS — R350 Frequency of micturition: Secondary | ICD-10-CM | POA: Diagnosis not present

## 2015-04-30 DIAGNOSIS — B373 Candidiasis of vulva and vagina: Secondary | ICD-10-CM

## 2015-04-30 DIAGNOSIS — N898 Other specified noninflammatory disorders of vagina: Secondary | ICD-10-CM | POA: Diagnosis not present

## 2015-04-30 DIAGNOSIS — B3731 Acute candidiasis of vulva and vagina: Secondary | ICD-10-CM

## 2015-04-30 LAB — WET PREP FOR TRICH, YEAST, CLUE
CLUE CELLS WET PREP: NONE SEEN
Trich, Wet Prep: NONE SEEN
YEAST WET PREP: NONE SEEN

## 2015-04-30 LAB — URINALYSIS W MICROSCOPIC + REFLEX CULTURE
BACTERIA UA: NONE SEEN [HPF]
Bilirubin Urine: NEGATIVE
CRYSTALS: NONE SEEN [HPF]
Casts: NONE SEEN [LPF]
Glucose, UA: NEGATIVE
Hgb urine dipstick: NEGATIVE
Ketones, ur: NEGATIVE
Leukocytes, UA: NEGATIVE
Nitrite: NEGATIVE
PROTEIN: NEGATIVE
RBC / HPF: NONE SEEN RBC/HPF (ref <=2)
Specific Gravity, Urine: 1.02 (ref 1.001–1.035)
WBC, UA: NONE SEEN WBC/HPF (ref <=5)
YEAST: NONE SEEN [HPF]
pH: 5.5 (ref 5.0–8.0)

## 2015-04-30 MED ORDER — FLUCONAZOLE 100 MG PO TABS: ORAL_TABLET | ORAL | Status: DC

## 2015-04-30 NOTE — Progress Notes (Signed)
Patient ID: Deborah Hampton, female   DOB: [birth year], 59 y.o.   MRN: [id number] Presents with complaint of increased vaginal irritation with mild itching and odor. Used Diflucan 1 tablet last week. History of recurrent yeast in the past but has minimal problems in the past few years, has used several doses of Diflucan 100 mg with good relief in the past. TVH. Reports mild irritation with urination without pain or burning. Denies abdominal pain or fever.  Exam: Appears well. External genitalia mild erythema, speculum exam vaginal walls erythematous, scant white discharge, wet prep negative. UA: Negative  Vaginal irritation/ probable yeast  Plan: Diflucan 100 by mouth today repeat in 3 days if needed. Call if no relief of symptoms. Loose clothing.

## 2015-04-30 NOTE — Patient Instructions (Signed)

## 2015-05-29 ENCOUNTER — Ambulatory Visit (INDEPENDENT_AMBULATORY_CARE_PROVIDER_SITE_OTHER): Payer: BLUE CROSS/BLUE SHIELD | Admitting: Gynecology

## 2015-05-29 ENCOUNTER — Encounter: Payer: Self-pay | Admitting: Gynecology

## 2015-05-29 VITALS — BP 120/76

## 2015-05-29 DIAGNOSIS — R350 Frequency of micturition: Secondary | ICD-10-CM | POA: Diagnosis not present

## 2015-05-29 DIAGNOSIS — N898 Other specified noninflammatory disorders of vagina: Secondary | ICD-10-CM

## 2015-05-29 LAB — URINALYSIS W MICROSCOPIC + REFLEX CULTURE
BILIRUBIN URINE: NEGATIVE
Casts: NONE SEEN [LPF]
Crystals: NONE SEEN [HPF]
GLUCOSE, UA: NEGATIVE
KETONES UR: NEGATIVE
Leukocytes, UA: NEGATIVE
NITRITE: NEGATIVE
Protein, ur: NEGATIVE
SPECIFIC GRAVITY, URINE: 1.015 (ref 1.001–1.035)
Yeast: NONE SEEN [HPF]
pH: 5.5 (ref 5.0–8.0)

## 2015-05-29 LAB — WET PREP FOR TRICH, YEAST, CLUE
Clue Cells Wet Prep HPF POC: NONE SEEN
Trich, Wet Prep: NONE SEEN

## 2015-05-29 MED ORDER — FLUCONAZOLE 150 MG PO TABS: 150.0000 mg | ORAL_TABLET | Freq: Every day | ORAL | Status: DC

## 2015-05-29 MED ORDER — SULFAMETHOXAZOLE-TRIMETHOPRIM 800-160 MG PO TABS: 1.0000 | ORAL_TABLET | Freq: Two times a day (BID) | ORAL | Status: DC

## 2015-05-29 NOTE — Addendum Note (Signed)
Addended by: Dayna Barker on: 05/29/2015 12:08 PM   Modules accepted: Orders

## 2015-05-29 NOTE — Progress Notes (Signed)
Deborah Hampton 1955-05-28 [id number]        60 y.o.  [id number] resents with 2 weeks of vaginal discharge with odor and itching. Several days of urinary frequency and urgency. No fever or chills low back pain or significant dysuria. Was treated for yeast in December by Harriett Sine.  Past medical history,surgical history, problem list, medications, allergies, family history and social history were all reviewed and documented in the EPIC chart.  Directed ROS with pertinent positives and negatives documented in the history of present illness/assessment and plan.  Exam: Kennon Portela assistant Filed Vitals:   05/29/15 1111  BP: 120/76   General appearance:  Normal Spine straight without CVA tenderness Abdomen soft nontender without masses guarding rebound Pelvic external BUS vagina with white discharge.  Bimanual without masses or tenderness  Assessment/Plan:  60 y.o. [id number] with history, exam, wet prep and urinalysis consistent with UTI showing 6-10 WBC, 3-10 RBC and few bacteria as well as positive yeast on wet prep. Will treat with Septra DS 1 by mouth twice a day 3 days and Diflucan 150 mg daily 3 days. Follow up if symptoms persist, worsen or recur.    Dara Lords MD, 11:45 AM 05/29/2015

## 2015-05-29 NOTE — Patient Instructions (Signed)
Take the Septra antibiotics twice daily for 3 days. Take the Diflucan pill daily or 3 days. Follow up if your symptoms persist, worsen or recur.

## 2015-05-31 LAB — URINE CULTURE: Colony Count: 85000

## 2015-06-09 ENCOUNTER — Encounter: Payer: Self-pay | Admitting: Gynecology

## 2015-06-09 ENCOUNTER — Ambulatory Visit (INDEPENDENT_AMBULATORY_CARE_PROVIDER_SITE_OTHER): Payer: BLUE CROSS/BLUE SHIELD | Admitting: Gynecology

## 2015-06-09 VITALS — BP 120/76

## 2015-06-09 DIAGNOSIS — R3915 Urgency of urination: Secondary | ICD-10-CM

## 2015-06-09 LAB — URINALYSIS W MICROSCOPIC + REFLEX CULTURE
BILIRUBIN URINE: NEGATIVE
CASTS: NONE SEEN [LPF]
CRYSTALS: NONE SEEN [HPF]
GLUCOSE, UA: NEGATIVE
KETONES UR: NEGATIVE
Leukocytes, UA: NEGATIVE
Nitrite: NEGATIVE
PROTEIN: NEGATIVE
Specific Gravity, Urine: 1.02 (ref 1.001–1.035)
WBC UA: NONE SEEN WBC/HPF (ref <=5)
Yeast: NONE SEEN [HPF]
pH: 7 (ref 5.0–8.0)

## 2015-06-09 MED ORDER — CIPROFLOXACIN HCL 250 MG PO TABS: 250.0000 mg | ORAL_TABLET | Freq: Two times a day (BID) | ORAL | Status: DC

## 2015-06-09 NOTE — Patient Instructions (Signed)
Take the ciprofloxacin antibiotic twice daily for 7 days.  Follow-up if your symptoms persist, worsen or recur. 

## 2015-06-09 NOTE — Progress Notes (Signed)
Deborah Hampton 1955/12/01 [id number]        60 y.o.  [id number] presents having been seen 05/29/2015 with vaginal discharge, odor and itching. Also some frequency and urgency. Was diagnosed with UTI and yeast. Was treated with Diflucan and Septra. Her vaginal discharge itching and irritation resolved but her urinary urgency continued. No fever, chills or low back pain.  Urine culture did show Klebsiella sensitive to Septra less then 20 MIC. Ciprofloxacin was less than 0.25.  Past medical history,surgical history, problem list, medications, allergies, family history and social history were all reviewed and documented in the EPIC chart.  Directed ROS with pertinent positives and negatives documented in the history of present illness/assessment and plan.  Exam: Kennon Portela assistant Filed Vitals:   06/09/15 1008  BP: 120/76   General appearance:  Normal Abdomen soft nontender without masses guarding rebound Pelvic external BUS vagina normal. Bimanual without masses or tenderness.  Assessment/Plan:  60 y.o. [id number] with above history. Urinalysis shows few bacteria but otherwise negative. We'll cover as a persistent low-level UTI with ciprofloxacin 250 mg twice a day 7 days. Patient will follow up if symptoms persist, worsen or recur. Patient and I discussed possible urologic referral if continues.    Dara Lords MD, 10:33 AM 06/09/2015

## 2015-06-10 LAB — URINE CULTURE
COLONY COUNT: NO GROWTH
ORGANISM ID, BACTERIA: NO GROWTH

## 2015-07-09 ENCOUNTER — Other Ambulatory Visit: Payer: Self-pay | Admitting: Gynecology

## 2015-07-29 ENCOUNTER — Telehealth: Payer: Self-pay | Admitting: *Deleted

## 2015-07-29 ENCOUNTER — Other Ambulatory Visit: Payer: Self-pay | Admitting: Women's Health

## 2015-07-29 ENCOUNTER — Encounter: Payer: Self-pay | Admitting: Women's Health

## 2015-07-29 ENCOUNTER — Ambulatory Visit (INDEPENDENT_AMBULATORY_CARE_PROVIDER_SITE_OTHER): Payer: BLUE CROSS/BLUE SHIELD | Admitting: Women's Health

## 2015-07-29 VITALS — BP 132/90

## 2015-07-29 DIAGNOSIS — N898 Other specified noninflammatory disorders of vagina: Secondary | ICD-10-CM

## 2015-07-29 DIAGNOSIS — R8290 Unspecified abnormal findings in urine: Secondary | ICD-10-CM | POA: Diagnosis not present

## 2015-07-29 DIAGNOSIS — R6882 Decreased libido: Secondary | ICD-10-CM

## 2015-07-29 LAB — URINALYSIS W MICROSCOPIC + REFLEX CULTURE
BILIRUBIN URINE: NEGATIVE
Bacteria, UA: NONE SEEN [HPF]
Casts: NONE SEEN [LPF]
Crystals: NONE SEEN [HPF]
GLUCOSE, UA: NEGATIVE
Hgb urine dipstick: NEGATIVE
Ketones, ur: NEGATIVE
Leukocytes, UA: NEGATIVE
Nitrite: NEGATIVE
PH: 7 (ref 5.0–8.0)
Protein, ur: NEGATIVE
RBC / HPF: NONE SEEN RBC/HPF (ref <=2)
SPECIFIC GRAVITY, URINE: 1.01 (ref 1.001–1.035)
WBC UA: NONE SEEN WBC/HPF (ref <=5)
Yeast: NONE SEEN [HPF]

## 2015-07-29 LAB — WET PREP FOR TRICH, YEAST, CLUE
Clue Cells Wet Prep HPF POC: NONE SEEN
TRICH WET PREP: NONE SEEN
YEAST WET PREP: NONE SEEN

## 2015-07-29 MED ORDER — FLIBANSERIN 100 MG PO TABS: 100.0000 mg | ORAL_TABLET | Freq: Every morning | ORAL | Status: DC

## 2015-07-29 NOTE — Patient Instructions (Signed)
Flibanserin oral tablets What is this medicine? FLIBANSERIN (fly BAN ser in) is used to treat hypoactive (low) sexual desire disorder (HSDD) in women who have not gone through menopause, who have not had low sexual desire in the past, and who have low sexual desire no matter the type of sexual activity, the situation, or the sexual partner. Women with HSDD have a low sexual desire that is troubling to them, and is not due to a medical or mental health problem, problems in the relationship, medicines, or drug abuse. This medicine is not for HSDD in women who have gone through menopause. This medicine is not for men. This medicine not for use to improve sexual performance. This medicine may be used for other purposes; ask your health care provider or pharmacist if you have questions. What should I tell my health care provider before I take this medicine? They need to know if you have any of these conditions: -dehydration -if you drink alcohol -drug abuse or addiction -heart disease -history of depression or other mental health problems -history of a drug or alcohol abuse problem -liver disease -low blood pressure -an unusual or allergic reaction to flibanserin, other medicines, foods, dyes, or preservatives -pregnant or trying to get pregnant -breast-feeding How should I use this medicine? Take this medicine by mouth with a glass of water. Do not take with alcohol. Do not take with grapefruit juice. Follow the directions on the prescription label. This medicine should only be taken at bedtime. Taking it at a time other than bedtime can increase your risk of low blood pressure, fainting, accidental injury, and daytime drowsiness. Take your medicine at regular intervals. Do not take it more often than directed. Do not stop taking except on your doctor's advice. Talk to your pediatrician regarding the use of this medicine in children. This medicine is not for use in children. Overdosage: If you think  you have taken too much of this medicine contact a poison control center or emergency room at once. NOTE: This medicine is only for you. Do not share this medicine with others. What if I miss a dose? If you miss your dose at bedtime, skip the missed dose and take the next dose at bedtime the next day. Do not take this medicine the next morning or double your next dose. What may interact with this medicine? Do not take this medicine with any of the following medications: -alcohol -amprenavir -atazanavir -boceprevir -ciprofloxacin -clarithromycin -conivaptan -diltiazem -erythromycin -fluconazole -fosamprenavir -grapefruit juice -indinavir -itraconazole -ketoconazole -nefazodone -nelfinavir -posaconazole -ritonavir -saquinavir -telaprevir -telithromycin -verapamil This medicine may also interact with the following medications: -birth control pills -bupropion -certain medicines for anxiety or sleep -certain medicines for seizures like carbamazepine, phenobarbital, phenytoin -certain medicines for stomach problems like cimetidine, esomeprazole, dexlansoprazole, lansoprazole, omeprazole, pantoprazole, rabeprazole, ranitidine -digoxin -diphenhydramine -etravirine -fluoxetine -fluvoxamine -ginkgo biloba -lorcaserin -narcotic medicines for pain -nefazodone -resveratrol -rifabutin -rifampin -rifapentine -simvastatin -sirolimus -St. John's Wort This list may not describe all possible interactions. Give your health care provider a list of all the medicines, herbs, non-prescription drugs, or dietary supplements you use. Also tell them if you smoke, drink alcohol, or use illegal drugs. Some items may interact with your medicine. What should I watch for while using this medicine? Visit your doctor or health care professional for regular checks on your progress. Tell your doctor if your symptoms have not improved after you have taken this medicine for 8 weeks. You may get dizzy  or drowsy. Do not drive, use machinery,  or do anything that needs mental alertness for at least 6 hours after you take your dose and until you know how this medicine affects you. The risk of severe drowsiness is increased if you are also taking other medicines that cause drowsiness, or if you take this medicine during waking hours. Only take this medicine at bedtime. Alcohol can increase dizziness and drowsiness, and can increase the risk of low blood pressure or fainting spells when combined with this medicine. Do not drink any alcoholic beverages while using this medicine. Do not stand or sit up quickly. This reduces the risk of dizzy or fainting spells. This medicine can cause low blood pressure, sometimes with dizziness and fainting spells. If you begin to feel dizzy or lightheaded, lie down and call for help if the symptoms don't go away. Check with your doctor or health care professional if you get an attack of severe diarrhea or vomiting, or if you sweat a lot. The loss of too much body fluid may increase your risk for low blood pressure, dizziness, or fainting. This medicine is only available through a restricted program called the ADDYI REMS Program, and can only be obtained through certified pharmacies participating in the program. For more information about the Program and a list of pharmacies that are enrolled in the Program, go to www.AddyiREMS.com or call 1-844-PINK-PILL ([phone]). What side effects may I notice from receiving this medicine? Side effects that you should report to your doctor or health care professional as soon as possible: -allergic reactions like skin rash, itching or hives, swelling of the face, lips, or tongue -extreme drowsiness -signs and symptoms of low blood pressure like dizziness; feeling faint or lightheaded, falls; unusually weak or tired Side effects that usually do not require medical attention (Report these to your doctor or health care professional if  they continue or are bothersome.): -constipation -drowsiness -dry mouth -nausea -trouble sleeping This list may not describe all possible side effects. Call your doctor for medical advice about side effects. You may report side effects to FDA at 1-800-FDA-1088. Where should I keep my medicine? Keep out of the reach of children. Store at room temperature between 15 and 30 degrees C (59 and 86 degrees F). Throw away any unused medicine after the expiration date. NOTE: This sheet is a summary. It may not cover all possible information. If you have questions about this medicine, talk to your doctor, pharmacist, or health care provider.    2016, Elsevier/Gold Standard. (2014-12-04 13:14:24)

## 2015-07-29 NOTE — Telephone Encounter (Signed)
Please call and inform rx sent in take daily, no alcohol while taking, make time for date night.

## 2015-07-29 NOTE — Progress Notes (Signed)
Patient ID: Deborah PiliCaroline H Burnett, female   DOB: Sep 11, 1955, 60 y.o.   MRN: [id number] Presents with urine odor, small amount of white vaginal discharge, and vaginal itching for the past week. Describes the odor as "ammonia" smelling. Has been eating increased amount of asparagus. Denies fever, abdominal/pelvic pain, vaginal bleeding, or dysuria. Also reports decreased libido.  Exam: Appears well. External genitalia normal. Speculum exam: No discharge, erythema, or odor noted. Cervix normal. Urinalysis negative. Wet prep negative.  Urine odor  Plan: Reassurance given about exam and lab findings. Advised increased fluid intake. Information given on Addyi. Importance of no alcohol with Addyi. Aware to call with any questions or concerns.

## 2015-07-29 NOTE — Telephone Encounter (Signed)
Pt was seen today would like Rx for Addyi sent to the pharmacy. Please advise

## 2015-07-29 NOTE — Telephone Encounter (Signed)
Patient informed. 

## 2015-07-30 ENCOUNTER — Telehealth: Payer: Self-pay | Admitting: *Deleted

## 2015-07-30 NOTE — Telephone Encounter (Signed)
Prior Authorization for ADDYI has been approved with [employer] effective dates 07/30/15-10/27/15  Reference # [id number], [employer] informed as well.

## 2015-08-26 ENCOUNTER — Ambulatory Visit (INDEPENDENT_AMBULATORY_CARE_PROVIDER_SITE_OTHER): Payer: BLUE CROSS/BLUE SHIELD | Admitting: Women's Health

## 2015-08-26 ENCOUNTER — Encounter: Payer: Self-pay | Admitting: Women's Health

## 2015-08-26 VITALS — BP 128/80 | Ht 68.0 in | Wt 198.0 lb

## 2015-08-26 DIAGNOSIS — R350 Frequency of micturition: Secondary | ICD-10-CM

## 2015-08-26 DIAGNOSIS — B3731 Acute candidiasis of vulva and vagina: Secondary | ICD-10-CM

## 2015-08-26 DIAGNOSIS — N898 Other specified noninflammatory disorders of vagina: Secondary | ICD-10-CM | POA: Diagnosis not present

## 2015-08-26 DIAGNOSIS — B373 Candidiasis of vulva and vagina: Secondary | ICD-10-CM

## 2015-08-26 LAB — WET PREP FOR TRICH, YEAST, CLUE
Clue Cells Wet Prep HPF POC: NONE SEEN
TRICH WET PREP: NONE SEEN
Yeast Wet Prep HPF POC: NONE SEEN

## 2015-08-26 MED ORDER — FLUCONAZOLE 150 MG PO TABS: ORAL_TABLET | ORAL | Status: DC

## 2015-08-26 NOTE — Patient Instructions (Signed)

## 2015-08-26 NOTE — Progress Notes (Signed)
Patient ID: Deborah Hampton, female   DOB: 15-Mar-1956, 60 y.o.   MRN: [id number] Presents with complaint of vaginal discharge with itching, burning sensation and irritation for greater than one week. Used over-the-counter Monistat that made symptoms worse, causing a swelling to external genitalia. Same partner. TVH/no HRT. Denies pain or burning with urination, abdominal pain or fever.  Exam: Appears well. External genitalia erythematous, no visible lesions, speculum exam moderate white creamy discharge noted, wet prep negative, (was difficult to assess due to cream in specimen).  Clinical yeast vaginitis  Plan: Diflucan 150 by mouth today repeat in 3 days if needed. Prescription, proper use given and reviewed. Yeast prevention discussed, instructed to call if no relief of symptoms.

## 2015-08-27 LAB — URINALYSIS W MICROSCOPIC + REFLEX CULTURE
Bacteria, UA: NONE SEEN [HPF]
Bilirubin Urine: NEGATIVE
Casts: NONE SEEN [LPF]
GLUCOSE, UA: NEGATIVE
Hgb urine dipstick: NEGATIVE
LEUKOCYTES UA: NEGATIVE
NITRITE: NEGATIVE
PH: 5.5 (ref 5.0–8.0)
Protein, ur: NEGATIVE
Specific Gravity, Urine: 1.024 (ref 1.001–1.035)
YEAST: NONE SEEN [HPF]

## 2015-08-28 LAB — URINE CULTURE: Colony Count: >=100000

## 2015-11-04 ENCOUNTER — Ambulatory Visit (INDEPENDENT_AMBULATORY_CARE_PROVIDER_SITE_OTHER): Payer: BLUE CROSS/BLUE SHIELD | Admitting: Gynecology

## 2015-11-04 ENCOUNTER — Encounter: Payer: Self-pay | Admitting: Gynecology

## 2015-11-04 VITALS — BP 118/76

## 2015-11-04 DIAGNOSIS — M545 Low back pain, unspecified: Secondary | ICD-10-CM

## 2015-11-04 DIAGNOSIS — R1031 Right lower quadrant pain: Secondary | ICD-10-CM

## 2015-11-04 LAB — URINALYSIS W MICROSCOPIC + REFLEX CULTURE
BACTERIA UA: NONE SEEN [HPF]
BILIRUBIN URINE: NEGATIVE
CASTS: NONE SEEN [LPF]
CRYSTALS: NONE SEEN [HPF]
Glucose, UA: NEGATIVE
Hgb urine dipstick: NEGATIVE
KETONES UR: NEGATIVE
Leukocytes, UA: NEGATIVE
Nitrite: NEGATIVE
PROTEIN: NEGATIVE
RBC / HPF: NONE SEEN RBC/HPF (ref <=2)
SPECIFIC GRAVITY, URINE: 1.01 (ref 1.001–1.035)
WBC UA: NONE SEEN WBC/HPF (ref <=5)
Yeast: NONE SEEN [HPF]
pH: 5.5 (ref 5.0–8.0)

## 2015-11-04 NOTE — Progress Notes (Signed)
    Deborah PiliCaroline H Hampton [birth year] [id number]        60 y.o.  [id number] presents with the acute onset of right lower quadrant pain last night. Reports that it is nagging to cramping in nature.  No nausea vomiting diarrhea. Had small bowel movement this morning but feels somewhat constipated. No fever or chills. Has not eaten a think today by choice because she is fasting. Has drank fluids without difficulty. No frequency dysuria urgency or low back pain. Status post TVH in the past.  Does feel like it's getting better as the day goes on.  Past medical history,surgical history, problem list, medications, allergies, family history and social history were all reviewed and documented in the EPIC chart.  Directed ROS with pertinent positives and negatives documented in the history of present illness/assessment and plan.  Exam: Kennon PortelaKim Gardner assistant Filed Vitals:   11/04/15 1115  BP: 118/76   General appearance:  Normal Spine straight without CVA tenderness Abdomen soft with mild tenderness to deep palpation in the right lower quadrant. No rebound guarding masses with active bowel sounds throughout. Pelvic bimanual without masses or tenderness. Rectal exam is normal.  Assessment/Plan:  60 y.o. [id number] with history as above. She does have a history of ovarian cysts a number of years ago and that's why she came here to make sure she was not having this. Given her history suspect probably more GI related. Will check baseline ultrasound to rule out ovarian process. Patient will schedule and follow up for this. Also check a baseline urine analysis. Will follow pain for now and if continues to improve and resolve and studies are negative them will follow. If otherwise then will triage based on results. If pain continues or worsens and studies are negative then will refer to gastroenterology.    Dara LordsFONTAINE,Chozen Latulippe P MD, 11:30 AM 11/04/2015

## 2015-11-04 NOTE — Patient Instructions (Signed)
Follow up for ultrasound as scheduled 

## 2015-11-13 ENCOUNTER — Ambulatory Visit: Payer: BLUE CROSS/BLUE SHIELD | Admitting: Gynecology

## 2015-11-13 ENCOUNTER — Other Ambulatory Visit: Payer: BLUE CROSS/BLUE SHIELD

## 2015-12-02 ENCOUNTER — Encounter: Payer: Self-pay | Admitting: Women's Health

## 2015-12-02 ENCOUNTER — Ambulatory Visit (INDEPENDENT_AMBULATORY_CARE_PROVIDER_SITE_OTHER): Payer: BLUE CROSS/BLUE SHIELD | Admitting: Women's Health

## 2015-12-02 VITALS — BP 138/80 | Ht 68.0 in | Wt 203.0 lb

## 2015-12-02 DIAGNOSIS — Z13820 Encounter for screening for osteoporosis: Secondary | ICD-10-CM | POA: Diagnosis not present

## 2015-12-02 DIAGNOSIS — Z01419 Encounter for gynecological examination (general) (routine) without abnormal findings: Secondary | ICD-10-CM
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
## 2015-12-02 NOTE — Patient Instructions (Signed)
Health Menopause is a normal process in which your reproductive ability comes to an end. This process happens gradually over a span of months to years, usually between the ages of 15 and 18. Menopause is complete when you have missed 12 consecutive menstrual periods. It is important to talk with your health care provider about some of the most common conditions that affect postmenopausal women, such as heart disease, cancer, and bone loss (osteoporosis). Adopting a healthy lifestyle and getting preventive care can help to promote your health and wellness. Those actions can also lower your chances of developing some of these common conditions. WHAT SHOULD I KNOW ABOUT MENOPAUSE? During menopause, you may experience a number of symptoms, such as:  Moderate-to-severe hot flashes.  Night sweats.  Decrease in sex drive.  Mood swings.  Headaches.  Tiredness.  Irritability.  Memory problems.  Insomnia. Choosing to treat or not to treat menopausal changes is an individual decision that you make with your health care provider. WHAT SHOULD I KNOW ABOUT HORMONE REPLACEMENT THERAPY AND SUPPLEMENTS? Hormone therapy products are effective for treating symptoms that are associated with menopause, such as hot flashes and night sweats. Hormone replacement carries certain risks, especially as you become older. If you are thinking about using estrogen or estrogen with progestin treatments, discuss the benefits and risks with your health care provider. WHAT SHOULD I KNOW ABOUT HEART DISEASE AND STROKE? Heart disease, heart attack, and stroke become more likely as you age. This may be due, in part, to the hormonal changes that your body experiences during menopause. These can affect how your body processes dietary fats, triglycerides, and cholesterol. Heart attack and stroke are both medical emergencies. There are many things that you can do to help prevent heart disease and stroke:  Have your blood pressure  checked at least every 1-2 years. High blood pressure causes heart disease and increases the risk of stroke.  If you are 59-59 years old, ask your health care provider if you should take aspirin to prevent a heart attack or a stroke.  Do not use any tobacco products, including cigarettes, chewing tobacco, or electronic cigarettes. If you need help quitting, ask your health care provider.  It is important to eat a healthy diet and maintain a healthy weight.  Be sure to include plenty of vegetables, fruits, low-fat dairy products, and lean protein.  Avoid eating foods that are high in solid fats, added sugars, or salt (sodium).  Get regular exercise. This is one of the most important things that you can do for your health.  Try to exercise for at least 150 minutes each week. The type of exercise that you do should increase your heart rate and make you sweat. This is known as moderate-intensity exercise.  Try to do strengthening exercises at least twice each week. Do these in addition to the moderate-intensity exercise.  Know your numbers.Ask your health care provider to check your cholesterol and your blood glucose. Continue to have your blood tested as directed by your health care provider. WHAT SHOULD I KNOW ABOUT CANCER SCREENING? There are several types of cancer. Take the following steps to reduce your risk and to catch any cancer development as early as possible. Breast Cancer  Practice breast self-awareness.  This means understanding how your breasts normally appear and feel.  It also means doing regular breast self-exams. Let your health care provider know about any changes, no matter how small.  If you are 59 or older, have a clinician do  breast exam (clinical breast exam or CBE) every year. Depending on your age, family history, and medical history, it may be recommended that you also have a yearly breast X-ray (mammogram).  If you have a family history of breast cancer,  talk with your health care provider about genetic screening.  If you are at high risk for breast cancer, talk with your health care provider about having an MRI and a mammogram every year.  Breast cancer (BRCA) gene test is recommended for women who have family members with BRCA-related cancers. Results of the assessment will determine the need for genetic counseling and BRCA1 and for BRCA2 testing. BRCA-related cancers include these types:  Breast. This occurs in males or females.  Ovarian.  Tubal. This may also be called fallopian tube cancer.  Cancer of the abdominal or pelvic lining (peritoneal cancer).  Prostate.  Pancreatic. Cervical, Uterine, and Ovarian Cancer Your health care provider may recommend that you be screened regularly for cancer of the pelvic organs. These include your ovaries, uterus, and vagina. This screening involves a pelvic exam, which includes checking for microscopic changes to the surface of your cervix (Pap test).  For women ages 59-59, health care providers may recommend a pelvic exam and a Pap test every three years. For women ages 30-65, they may recommend the Pap test and pelvic exam, combined with testing for human papilloma virus (HPV), every five years. Some types of HPV increase your risk of cervical cancer. Testing for HPV may also be done on women of any age who have unclear Pap test results.  Other health care providers may not recommend any screening for nonpregnant women who are considered low risk for pelvic cancer and have no symptoms. Ask your health care provider if a screening pelvic exam is right for you.  If you have had past treatment for cervical cancer or a condition that could lead to cancer, you need Pap tests and screening for cancer for at least 20 years after your treatment. If Pap tests have been discontinued for you, your risk factors (such as having a new sexual partner) need to be reassessed to determine if you should start having  screenings again. Some women have medical problems that increase the chance of getting cervical cancer. In these cases, your health care provider may recommend that you have screening and Pap tests more often.  If you have a family history of uterine cancer or ovarian cancer, talk with your health care provider about genetic screening.  If you have vaginal bleeding after reaching menopause, tell your health care provider.  There are currently no reliable tests available to screen for ovarian cancer. Lung Cancer Lung cancer screening is recommended for adults 55-80 years old who are at high risk for lung cancer because of a history of smoking. A yearly low-dose CT scan of the lungs is recommended if you:  Currently smoke.  Have a history of at least 30 pack-years of smoking and you currently smoke or have quit within the past 15 years. A pack-year is smoking an average of one pack of cigarettes per day for one year. Yearly screening should:  Continue until it has been 15 years since you quit.  Stop if you develop a health problem that would prevent you from having lung cancer treatment. Colorectal Cancer  This type of cancer can be detected and can often be prevented.  Routine colorectal cancer screening usually begins at age 50 and continues through age 75.  If you have   risk factors for colon cancer, your health care provider may recommend that you be screened at an earlier age.  If you have a family history of colorectal cancer, talk with your health care provider about genetic screening.  Your health care provider may also recommend using home test kits to check for hidden blood in your stool.  A small camera at the end of a tube can be used to examine your colon directly (sigmoidoscopy or colonoscopy). This is done to check for the earliest forms of colorectal cancer.  Direct examination of the colon should be repeated every 5-10 years until age 75. However, if early forms of  precancerous polyps or small growths are found or if you have a family history or genetic risk for colorectal cancer, you may need to be screened more often. Skin Cancer  Check your skin from head to toe regularly.  Monitor any moles. Be sure to tell your health care provider:  About any new moles or changes in moles, especially if there is a change in a mole's shape or color.  If you have a mole that is larger than the size of a pencil eraser.  If any of your family members has a history of skin cancer, especially at a young age, talk with your health care provider about genetic screening.  Always use sunscreen. Apply sunscreen liberally and repeatedly throughout the day.  Whenever you are outside, protect yourself by wearing long sleeves, pants, a wide-brimmed hat, and sunglasses. WHAT SHOULD I KNOW ABOUT OSTEOPOROSIS? Osteoporosis is a condition in which bone destruction happens more quickly than new bone creation. After menopause, you may be at an increased risk for osteoporosis. To help prevent osteoporosis or the bone fractures that can happen because of osteoporosis, the following is recommended:  If you are 19-50 years old, get at least 1,000 mg of calcium and at least 600 mg of vitamin D per day.  If you are older than age 50 but younger than age 70, get at least 1,200 mg of calcium and at least 600 mg of vitamin D per day.  If you are older than age 70, get at least 1,200 mg of calcium and at least 800 mg of vitamin D per day. Smoking and excessive alcohol intake increase the risk of osteoporosis. Eat foods that are rich in calcium and vitamin D, and do weight-bearing exercises several times each week as directed by your health care provider. WHAT SHOULD I KNOW ABOUT HOW MENOPAUSE AFFECTS MY MENTAL HEALTH? Depression may occur at any age, but it is more common as you become older. Common symptoms of depression include:  Low or sad mood.  Changes in sleep patterns.  Changes  in appetite or eating patterns.  Feeling an overall lack of motivation or enjoyment of activities that you previously enjoyed.  Frequent crying spells. Talk with your health care provider if you think that you are experiencing depression. WHAT SHOULD I KNOW ABOUT IMMUNIZATIONS? It is important that you get and maintain your immunizations. These include:  Tetanus, diphtheria, and pertussis (Tdap) booster vaccine.  Influenza every year before the flu season begins.  Pneumonia vaccine.  Shingles vaccine. Your health care provider may also recommend other immunizations.   This information is not intended to replace advice given to you by your health care provider. Make sure you discuss any questions you have with your health care provider.   Document Released: 06/17/2005 Document Revised: 05/16/2014 Document Reviewed: 12/26/2013 Elsevier Interactive Patient Education 2016 Elsevier   [employer].

## 2015-12-03 LAB — URINALYSIS W MICROSCOPIC + REFLEX CULTURE
BILIRUBIN URINE: NEGATIVE
Bacteria, UA: NONE SEEN [HPF]
Casts: NONE SEEN [LPF]
Crystals: NONE SEEN [HPF]
GLUCOSE, UA: NEGATIVE
HGB URINE DIPSTICK: NEGATIVE
KETONES UR: NEGATIVE
LEUKOCYTES UA: NEGATIVE
NITRITE: NEGATIVE
PH: 6.5 (ref 5.0–8.0)
Protein, ur: NEGATIVE
SQUAMOUS EPITHELIAL / LPF: NONE SEEN [HPF] (ref <=5)
Specific Gravity, Urine: 1.02 (ref 1.001–1.035)
YEAST: NONE SEEN [HPF]

## 2015-12-04 LAB — URINE CULTURE: Organism ID, Bacteria: NO GROWTH

## 2015-12-08 DIAGNOSIS — M8580 Other specified disorders of bone density and structure, unspecified site: Secondary | ICD-10-CM

## 2015-12-08 HISTORY — DX: Other specified disorders of bone density and structure, unspecified site: M85.80

## 2015-12-22 ENCOUNTER — Ambulatory Visit (INDEPENDENT_AMBULATORY_CARE_PROVIDER_SITE_OTHER): Payer: BLUE CROSS/BLUE SHIELD

## 2015-12-22 ENCOUNTER — Other Ambulatory Visit: Payer: Self-pay | Admitting: *Deleted

## 2015-12-22 ENCOUNTER — Other Ambulatory Visit: Payer: Self-pay | Admitting: Women's Health

## 2015-12-22 ENCOUNTER — Telehealth: Payer: Self-pay | Admitting: Gynecology

## 2015-12-22 ENCOUNTER — Encounter: Payer: Self-pay | Admitting: Gynecology

## 2015-12-22 DIAGNOSIS — Z1321 Encounter for screening for nutritional disorder: Secondary | ICD-10-CM

## 2015-12-22 DIAGNOSIS — M8580 Other specified disorders of bone density and structure, unspecified site: Secondary | ICD-10-CM

## 2015-12-22 DIAGNOSIS — Z13820 Encounter for screening for osteoporosis: Secondary | ICD-10-CM | POA: Diagnosis not present

## 2015-12-22 DIAGNOSIS — M899 Disorder of bone, unspecified: Secondary | ICD-10-CM

## 2015-12-22 NOTE — Telephone Encounter (Signed)
Tell patient her bone density does show a little bit of osteopenia but nothing bad. I would recommend having a vitamin D level checked next time she has blood drawn and then otherwise we'll plan on repeating this in 2 years.

## 2015-12-22 NOTE — Telephone Encounter (Signed)
Pt informed with the below note, order placed, pt will call to schedule.

## 2015-12-30 ENCOUNTER — Other Ambulatory Visit: Payer: Self-pay | Admitting: Women's Health

## 2015-12-30 DIAGNOSIS — B373 Candidiasis of vulva and vagina: Secondary | ICD-10-CM

## 2015-12-30 DIAGNOSIS — B3731 Acute candidiasis of vulva and vagina: Secondary | ICD-10-CM

## 2015-12-30 NOTE — Telephone Encounter (Signed)
Okay for refill, office visit if no relief. 

## 2016-05-09 HISTORY — PX: PARTIAL KNEE ARTHROPLASTY: SHX2174

## 2016-08-02 ENCOUNTER — Ambulatory Visit (INDEPENDENT_AMBULATORY_CARE_PROVIDER_SITE_OTHER): Payer: BLUE CROSS/BLUE SHIELD | Admitting: Women's Health

## 2016-08-02 ENCOUNTER — Encounter: Payer: Self-pay | Admitting: Women's Health

## 2016-08-02 VITALS — BP 130/88

## 2016-08-02 DIAGNOSIS — N760 Acute vaginitis: Secondary | ICD-10-CM | POA: Diagnosis not present

## 2016-08-02 DIAGNOSIS — R350 Frequency of micturition: Secondary | ICD-10-CM

## 2016-08-02 DIAGNOSIS — N898 Other specified noninflammatory disorders of vagina: Secondary | ICD-10-CM | POA: Diagnosis not present

## 2016-08-02 DIAGNOSIS — B373 Candidiasis of vulva and vagina: Secondary | ICD-10-CM

## 2016-08-02 DIAGNOSIS — B3731 Acute candidiasis of vulva and vagina: Secondary | ICD-10-CM

## 2016-08-02 DIAGNOSIS — B9689 Other specified bacterial agents as the cause of diseases classified elsewhere: Secondary | ICD-10-CM

## 2016-08-02 LAB — WET PREP FOR TRICH, YEAST, CLUE
TRICH WET PREP: NONE SEEN
YEAST WET PREP: NONE SEEN

## 2016-08-02 MED ORDER — METRONIDAZOLE 500 MG PO TABS: 500.0000 mg | ORAL_TABLET | Freq: Two times a day (BID) | ORAL | 0 refills | Status: DC

## 2016-08-02 MED ORDER — FLUCONAZOLE 150 MG PO TABS: ORAL_TABLET | ORAL | 0 refills | Status: DC

## 2016-08-02 NOTE — Progress Notes (Signed)
Presents with complaint of white discharge, itching, burning and irritation for 3 days. Repors took 1  Diflucan with no improvement. Denies abdominal pain, urinary symptoms, fever, back pain. Same partner. Marriage planned August 2018. History of TVH for endometriosis and fibroids in 1999, hypertension managed by primary care.   Exam: appears well. External genitalia normal. Speculum exam: vagina, redness, with moderate white discharge. Wet prep:moderate clue cells, and bacteria TNTC, moderate WBC.   Bacterial vaginosis  Plan: Flagyl 500mg  BID for 7 days, instructed no alcohol consumed. Healthy life style. Diet and exercise. calcium rich diet, Vit D 2000 U daily.

## 2016-08-02 NOTE — Patient Instructions (Signed)

## 2016-08-03 LAB — URINALYSIS W MICROSCOPIC + REFLEX CULTURE
BILIRUBIN URINE: NEGATIVE
Casts: NONE SEEN [LPF]
GLUCOSE, UA: NEGATIVE
HGB URINE DIPSTICK: NEGATIVE
Ketones, ur: NEGATIVE
LEUKOCYTES UA: NEGATIVE
Nitrite: NEGATIVE
PROTEIN: NEGATIVE
Specific Gravity, Urine: 1.026 (ref 1.001–1.035)
Yeast: NONE SEEN [HPF]
pH: 6.5 (ref 5.0–8.0)

## 2016-08-04 LAB — URINE CULTURE

## 2016-09-21 ENCOUNTER — Encounter: Payer: Self-pay | Admitting: Women's Health

## 2016-09-21 ENCOUNTER — Encounter: Payer: Self-pay | Admitting: Gynecology

## 2016-09-21 ENCOUNTER — Ambulatory Visit (INDEPENDENT_AMBULATORY_CARE_PROVIDER_SITE_OTHER): Payer: BLUE CROSS/BLUE SHIELD | Admitting: Women's Health

## 2016-09-21 VITALS — BP 132/80 | Ht 68.0 in | Wt 203.0 lb

## 2016-09-21 DIAGNOSIS — L298 Other pruritus: Secondary | ICD-10-CM | POA: Diagnosis not present

## 2016-09-21 DIAGNOSIS — B3731 Acute candidiasis of vulva and vagina: Secondary | ICD-10-CM

## 2016-09-21 DIAGNOSIS — B373 Candidiasis of vulva and vagina: Secondary | ICD-10-CM

## 2016-09-21 DIAGNOSIS — N898 Other specified noninflammatory disorders of vagina: Secondary | ICD-10-CM

## 2016-09-21 LAB — WET PREP FOR TRICH, YEAST, CLUE
CLUE CELLS WET PREP: NONE SEEN
Trich, Wet Prep: NONE SEEN
WBC WET PREP: NONE SEEN

## 2016-09-21 MED ORDER — FLUCONAZOLE 150 MG PO TABS: ORAL_TABLET | ORAL | 0 refills | Status: DC

## 2016-09-21 NOTE — Progress Notes (Signed)
Presents with complaint of intense vaginal itching for the last few days. Minimal discharge, denies odor, abdominal pain, fever or urinary symptoms. No change in bowel elimination.  TVH on no HRT. Same partner. Scheduled for the surgery in 2 weeks.  Exam: Appears well. External genitalia erythematous, speculum exam minimal discharge vaginal walls erythematous. Wet prep positive for moderate yeast with hyphae.  Yeast vaginitis  Plan: Diflucan 150 by mouth today and repeat in 3 days. Instructed to call if no relief. Yeast prevention discussed.

## 2016-09-21 NOTE — Patient Instructions (Signed)

## 2016-12-23 ENCOUNTER — Ambulatory Visit (INDEPENDENT_AMBULATORY_CARE_PROVIDER_SITE_OTHER): Payer: BLUE CROSS/BLUE SHIELD | Admitting: Gynecology

## 2016-12-23 ENCOUNTER — Encounter: Payer: Self-pay | Admitting: Gynecology

## 2016-12-23 VITALS — BP 116/76

## 2016-12-23 DIAGNOSIS — B9689 Other specified bacterial agents as the cause of diseases classified elsewhere: Secondary | ICD-10-CM

## 2016-12-23 DIAGNOSIS — N3000 Acute cystitis without hematuria: Secondary | ICD-10-CM

## 2016-12-23 DIAGNOSIS — N760 Acute vaginitis: Secondary | ICD-10-CM | POA: Diagnosis not present

## 2016-12-23 LAB — URINALYSIS W MICROSCOPIC + REFLEX CULTURE
Bilirubin Urine: NEGATIVE
Casts: NONE SEEN [LPF]
Crystals: NONE SEEN [HPF]
Glucose, UA: NEGATIVE
Ketones, ur: NEGATIVE
LEUKOCYTES UA: NEGATIVE
NITRITE: NEGATIVE
PH: 5.5 (ref 5.0–8.0)
PROTEIN: NEGATIVE
YEAST: NONE SEEN [HPF]

## 2016-12-23 LAB — WET PREP FOR TRICH, YEAST, CLUE
Trich, Wet Prep: NONE SEEN
YEAST WET PREP: NONE SEEN

## 2016-12-23 MED ORDER — TINIDAZOLE 500 MG PO TABS: 2.0000 g | ORAL_TABLET | Freq: Once | ORAL | 0 refills | Status: AC

## 2016-12-23 MED ORDER — SULFAMETHOXAZOLE-TRIMETHOPRIM 800-160 MG PO TABS: 1.0000 | ORAL_TABLET | Freq: Two times a day (BID) | ORAL | 0 refills | Status: DC

## 2016-12-23 NOTE — Addendum Note (Signed)
Addended by: Dayna Barker on: 12/23/2016 11:49 AM   Modules accepted: Orders

## 2016-12-23 NOTE — Progress Notes (Signed)
    Deborah Hampton [birth year] [id number]        61 y.o.  [id number] with a one to two-week history of worsening frequency, urgency and dysuria. No low back pain, fever or chills. Also notes a white vaginal discharge with itching. No odor. No nausea vomiting diarrhea constipation.  Past medical history,surgical history, problem list, medications, allergies, family history and social history were all reviewed and documented in the EPIC chart.  Directed ROS with pertinent positives and negatives documented in the history of present illness/assessment and plan.  Exam: Kennon Portela assistant Vitals:   12/23/16 1019  BP: 116/76   General appearance:  Normal Abdomen soft nontender without masses guarding rebound Pelvic external BUS vagina with white discharge. Bimanual without masses or tenderness.  Assessment/Plan:  61 y.o. [id number] with history and exam as above. Urinalysis consistent with UTI. Will treat with Septra DS 1 by mouth twice a day 3 days. Wet prep is consistent with bacterial vaginosis. Treatment options reviewed and the patient elects for Tindamax 2 g one day and 2 g the next day. Alcohol avoidance all taking the medication reviewed. Follow up if symptoms persist, worsen or recur.    Dara Lords MD, 10:42 AM 12/23/2016

## 2016-12-23 NOTE — Patient Instructions (Signed)
Take the Septra antibiotic twice daily for 3 days for the urinary tract infection. Takes the Tindamax medication 4 pills one day and 4 pills the next day to treat the bacterial vaginosis. Avoid alcohol while taking the medication.  Follow up if the symptoms persist, worsen or recur

## 2016-12-24 LAB — URINE CULTURE

## 2017-01-18 ENCOUNTER — Ambulatory Visit (INDEPENDENT_AMBULATORY_CARE_PROVIDER_SITE_OTHER): Payer: BLUE CROSS/BLUE SHIELD | Admitting: Women's Health

## 2017-01-18 VITALS — BP 115/74

## 2017-01-18 DIAGNOSIS — R350 Frequency of micturition: Secondary | ICD-10-CM

## 2017-01-18 DIAGNOSIS — N898 Other specified noninflammatory disorders of vagina: Secondary | ICD-10-CM

## 2017-01-18 LAB — WET PREP FOR TRICH, YEAST, CLUE

## 2017-01-18 MED ORDER — PHENAZOPYRIDINE HCL 200 MG PO TABS: 200.0000 mg | ORAL_TABLET | Freq: Three times a day (TID) | ORAL | 0 refills | Status: DC | PRN

## 2017-01-18 NOTE — Patient Instructions (Signed)

## 2017-01-18 NOTE — Progress Notes (Signed)
Presents with increased urinary urgency & frequency x 1 week. Denies burning with urination, visible blood in urine, vaginal discharge, abdominal pain or fever. Recent issues with occasional constipation reports some L sided abdominal discomfort. Recently underwent difficult knee surgery in May, continues with physical therapy, but she is recovering well now. Hysterectomy on no HRT. Hypertension.  Exam: Appears well. External genitalia- no erythema  Speculum exam- minimal discharge, no erythema noted. Wet prep negative UA : Negative leukocytes, 0-5 WBCs, 6-10 squamous epithelials, moderate bacteria.   Urgency/frequency of urination  Plan: Reviewed normality vaginal exam and urine. Pyridium 200 mg 3 times daily as needed. Urine culture pending. Discussed OTC options for constipation.

## 2017-01-19 LAB — URINALYSIS W MICROSCOPIC + REFLEX CULTURE
BILIRUBIN URINE: NEGATIVE
Glucose, UA: NEGATIVE
HYALINE CAST: NONE SEEN /LPF
Hgb urine dipstick: NEGATIVE
KETONES UR: NEGATIVE
Leukocyte Esterase: NEGATIVE
Nitrites, Initial: NEGATIVE
PROTEIN: NEGATIVE
RBC / HPF: NONE SEEN /HPF (ref 0–2)
SPECIFIC GRAVITY, URINE: 1.015 (ref 1.001–1.03)
pH: 6 (ref 5.0–8.0)

## 2017-01-19 LAB — NO CULTURE INDICATED

## 2017-02-21 ENCOUNTER — Ambulatory Visit (INDEPENDENT_AMBULATORY_CARE_PROVIDER_SITE_OTHER): Payer: BLUE CROSS/BLUE SHIELD | Admitting: Women's Health

## 2017-02-21 ENCOUNTER — Encounter: Payer: Self-pay | Admitting: Women's Health

## 2017-02-21 VITALS — BP 132/80

## 2017-02-21 DIAGNOSIS — N898 Other specified noninflammatory disorders of vagina: Secondary | ICD-10-CM | POA: Diagnosis not present

## 2017-02-21 DIAGNOSIS — B3731 Acute candidiasis of vulva and vagina: Secondary | ICD-10-CM

## 2017-02-21 DIAGNOSIS — B373 Candidiasis of vulva and vagina: Secondary | ICD-10-CM | POA: Diagnosis not present

## 2017-02-21 LAB — WET PREP FOR TRICH, YEAST, CLUE

## 2017-02-21 MED ORDER — FLUCONAZOLE 150 MG PO TABS: ORAL_TABLET | ORAL | 1 refills | Status: DC

## 2017-02-21 NOTE — Progress Notes (Signed)
61 year old MBF G6P2 presents  complaining of vaginal itching, foul odor, and yellow clumpy discharge worsening over the past week. Applied Monistat with mild relief. Denies urinary symptoms, back pain, fever/chills, vaginal bleeding. History of prior yeast infections and UTIs that have fully resolved with medication and states symptoms feel similar to yeast infection. Postmenopausal on no HRTs. Hypertension. Completed course of Septra on 12/23/16 for UTI and treated for BV with Tindamax at the same time.  Exam: Appears well, no discomfort. External genitalia is within normal limits. Speculum exam: Cervix is pink, cloudy white/yellow discharge visualized. Vaginal mucosa is normal.  Wet prep: yeast present. No trichomonas, no clue cells. Many bacteria. Few WBC. 7-12 hps epithelial cells.  Candida vulvovaginitis   Plan:  Diflucan 150 mg with a refill if symptoms persist.  Yeast prevention discussed.

## 2017-02-21 NOTE — Patient Instructions (Signed)

## 2017-08-01 ENCOUNTER — Encounter: Payer: Self-pay | Admitting: Women's Health

## 2017-08-01 ENCOUNTER — Ambulatory Visit (INDEPENDENT_AMBULATORY_CARE_PROVIDER_SITE_OTHER): Payer: BLUE CROSS/BLUE SHIELD | Admitting: Women's Health

## 2017-08-01 VITALS — BP 124/80

## 2017-08-01 DIAGNOSIS — B373 Candidiasis of vulva and vagina: Secondary | ICD-10-CM

## 2017-08-01 DIAGNOSIS — B3731 Acute candidiasis of vulva and vagina: Secondary | ICD-10-CM

## 2017-08-01 LAB — WET PREP FOR TRICH, YEAST, CLUE

## 2017-08-01 MED ORDER — FLUCONAZOLE 100 MG PO TABS: ORAL_TABLET | ORAL | 0 refills | Status: DC

## 2017-08-01 NOTE — Patient Instructions (Signed)

## 2017-08-01 NOTE — Progress Notes (Signed)
61 y.o MBF G6P2 presents with complaints of vaginal itching, white discharge with no odor that has been occurring for 3 weeks.  Used Diflucan with some relief.  History of chronic vaginal yeast infection.   1999 TVH for endometriosis and fibroids on no HRT.  Primary care manages hypertension.  Exam: Appears well, external genitalia no erythematous at the introitus.  Speculum exam erythema, scant white discharge, no odor noted.  With prep negative.  History of recurrent Candida vulvovaginits  Plan Diflucan 100 mg, take 2-day repeat in 3 days if needed and then 1 p.o. weekly for 3 weeks as needed.  Instructed to call if continued problems.  Well female exam scheduled for July, 2019.

## 2017-08-16 ENCOUNTER — Ambulatory Visit: Payer: BLUE CROSS/BLUE SHIELD | Admitting: Women's Health

## 2017-10-11 ENCOUNTER — Ambulatory Visit: Payer: BLUE CROSS/BLUE SHIELD | Admitting: Women's Health

## 2017-10-11 ENCOUNTER — Encounter: Payer: Self-pay | Admitting: Women's Health

## 2017-10-11 VITALS — BP 120/82 | Ht 68.0 in | Wt 191.0 lb

## 2017-10-11 DIAGNOSIS — Z13820 Encounter for screening for osteoporosis: Secondary | ICD-10-CM

## 2017-10-11 DIAGNOSIS — Z01419 Encounter for gynecological examination (general) (routine) without abnormal findings: Secondary | ICD-10-CM | POA: Diagnosis not present

## 2017-10-11 NOTE — Patient Instructions (Signed)
Health Maintenance for Postmenopausal Women Menopause is a normal process in which your reproductive ability comes to an end. This process happens gradually over a span of months to years, usually between the ages of 22 and 9. Menopause is complete when you have missed 12 consecutive menstrual periods. It is important to talk with your health care provider about some of the most common conditions that affect postmenopausal women, such as heart disease, cancer, and bone loss (osteoporosis). Adopting a healthy lifestyle and getting preventive care can help to promote your health and wellness. Those actions can also lower your chances of developing some of these common conditions. What should I know about menopause? During menopause, you may experience a number of symptoms, such as:  Moderate-to-severe hot flashes.  Night sweats.  Decrease in sex drive.  Mood swings.  Headaches.  Tiredness.  Irritability.  Memory problems.  Insomnia.  Choosing to treat or not to treat menopausal changes is an individual decision that you make with your health care provider. What should I know about hormone replacement therapy and supplements? Hormone therapy products are effective for treating symptoms that are associated with menopause, such as hot flashes and night sweats. Hormone replacement carries certain risks, especially as you become older. If you are thinking about using estrogen or estrogen with progestin treatments, discuss the benefits and risks with your health care provider. What should I know about heart disease and stroke? Heart disease, heart attack, and stroke become more likely as you age. This may be due, in part, to the hormonal changes that your body experiences during menopause. These can affect how your body processes dietary fats, triglycerides, and cholesterol. Heart attack and stroke are both medical emergencies. There are many things that you can do to help prevent heart disease  and stroke:  Have your blood pressure checked at least every 1-2 years. High blood pressure causes heart disease and increases the risk of stroke.  If you are 53-22 years old, ask your health care provider if you should take aspirin to prevent a heart attack or a stroke.  Do not use any tobacco products, including cigarettes, chewing tobacco, or electronic cigarettes. If you need help quitting, ask your health care provider.  It is important to eat a healthy diet and maintain a healthy weight. ? Be sure to include plenty of vegetables, fruits, low-fat dairy products, and lean protein. ? Avoid eating foods that are high in solid fats, added sugars, or salt (sodium).  Get regular exercise. This is one of the most important things that you can do for your health. ? Try to exercise for at least 150 minutes each week. The type of exercise that you do should increase your heart rate and make you sweat. This is known as moderate-intensity exercise. ? Try to do strengthening exercises at least twice each week. Do these in addition to the moderate-intensity exercise.  Know your numbers.Ask your health care provider to check your cholesterol and your blood glucose. Continue to have your blood tested as directed by your health care provider.  What should I know about cancer screening? There are several types of cancer. Take the following steps to reduce your risk and to catch any cancer development as early as possible. Breast Cancer  Practice breast self-awareness. ? This means understanding how your breasts normally appear and feel. ? It also means doing regular breast self-exams. Let your health care provider know about any changes, no matter how small.  If you are 40  or older, have a clinician do a breast exam (clinical breast exam or CBE) every year. Depending on your age, family history, and medical history, it may be recommended that you also have a yearly breast X-ray (mammogram).  If you  have a family history of breast cancer, talk with your health care provider about genetic screening.  If you are at high risk for breast cancer, talk with your health care provider about having an MRI and a mammogram every year.  Breast cancer (BRCA) gene test is recommended for women who have family members with BRCA-related cancers. Results of the assessment will determine the need for genetic counseling and BRCA1 and for BRCA2 testing. BRCA-related cancers include these types: ? Breast. This occurs in males or females. ? Ovarian. ? Tubal. This may also be called fallopian tube cancer. ? Cancer of the abdominal or pelvic lining (peritoneal cancer). ? Prostate. ? Pancreatic.  Cervical, Uterine, and Ovarian Cancer Your health care provider may recommend that you be screened regularly for cancer of the pelvic organs. These include your ovaries, uterus, and vagina. This screening involves a pelvic exam, which includes checking for microscopic changes to the surface of your cervix (Pap test).  For women ages 21-65, health care providers may recommend a pelvic exam and a Pap test every three years. For women ages 79-65, they may recommend the Pap test and pelvic exam, combined with testing for human papilloma virus (HPV), every five years. Some types of HPV increase your risk of cervical cancer. Testing for HPV may also be done on women of any age who have unclear Pap test results.  Other health care providers may not recommend any screening for nonpregnant women who are considered low risk for pelvic cancer and have no symptoms. Ask your health care provider if a screening pelvic exam is right for you.  If you have had past treatment for cervical cancer or a condition that could lead to cancer, you need Pap tests and screening for cancer for at least 20 years after your treatment. If Pap tests have been discontinued for you, your risk factors (such as having a new sexual partner) need to be  reassessed to determine if you should start having screenings again. Some women have medical problems that increase the chance of getting cervical cancer. In these cases, your health care provider may recommend that you have screening and Pap tests more often.  If you have a family history of uterine cancer or ovarian cancer, talk with your health care provider about genetic screening.  If you have vaginal bleeding after reaching menopause, tell your health care provider.  There are currently no reliable tests available to screen for ovarian cancer.  Lung Cancer Lung cancer screening is recommended for adults 69-62 years old who are at high risk for lung cancer because of a history of smoking. A yearly low-dose CT scan of the lungs is recommended if you:  Currently smoke.  Have a history of at least 30 pack-years of smoking and you currently smoke or have quit within the past 15 years. A pack-year is smoking an average of one pack of cigarettes per day for one year.  Yearly screening should:  Continue until it has been 15 years since you quit.  Stop if you develop a health problem that would prevent you from having lung cancer treatment.  Colorectal Cancer  This type of cancer can be detected and can often be prevented.  Routine colorectal cancer screening usually begins at  age 42 and continues through age 45.  If you have risk factors for colon cancer, your health care provider may recommend that you be screened at an earlier age.  If you have a family history of colorectal cancer, talk with your health care provider about genetic screening.  Your health care provider may also recommend using home test kits to check for hidden blood in your stool.  A small camera at the end of a tube can be used to examine your colon directly (sigmoidoscopy or colonoscopy). This is done to check for the earliest forms of colorectal cancer.  Direct examination of the colon should be repeated every  5-10 years until age 71. However, if early forms of precancerous polyps or small growths are found or if you have a family history or genetic risk for colorectal cancer, you may need to be screened more often.  Skin Cancer  Check your skin from head to toe regularly.  Monitor any moles. Be sure to tell your health care provider: ? About any new moles or changes in moles, especially if there is a change in a mole's shape or color. ? If you have a mole that is larger than the size of a pencil eraser.  If any of your family members has a history of skin cancer, especially at a Deborah Hampton age, talk with your health care provider about genetic screening.  Always use sunscreen. Apply sunscreen liberally and repeatedly throughout the day.  Whenever you are outside, protect yourself by wearing long sleeves, pants, a wide-brimmed hat, and sunglasses.  What should I know about osteoporosis? Osteoporosis is a condition in which bone destruction happens more quickly than new bone creation. After menopause, you may be at an increased risk for osteoporosis. To help prevent osteoporosis or the bone fractures that can happen because of osteoporosis, the following is recommended:  If you are 46-71 years old, get at least 1,000 mg of calcium and at least 600 mg of vitamin D per day.  If you are older than age 55 but younger than age 65, get at least 1,200 mg of calcium and at least 600 mg of vitamin D per day.  If you are older than age 54, get at least 1,200 mg of calcium and at least 800 mg of vitamin D per day.  Smoking and excessive alcohol intake increase the risk of osteoporosis. Eat foods that are rich in calcium and vitamin D, and do weight-bearing exercises several times each week as directed by your health care provider. What should I know about how menopause affects my mental health? Depression may occur at any age, but it is more common as you become older. Common symptoms of depression  include:  Low or sad mood.  Changes in sleep patterns.  Changes in appetite or eating patterns.  Feeling an overall lack of motivation or enjoyment of activities that you previously enjoyed.  Frequent crying spells.  Talk with your health care provider if you think that you are experiencing depression. What should I know about immunizations? It is important that you get and maintain your immunizations. These include:  Tetanus, diphtheria, and pertussis (Tdap) booster vaccine.  Influenza every year before the flu season begins.  Pneumonia vaccine.  Shingles vaccine.  Your health care provider may also recommend other immunizations. This information is not intended to replace advice given to you by your health care provider. Make sure you discuss any questions you have with your health care provider. Document Released: 06/17/2005  Document Revised: 11/13/2015 Document Reviewed: 01/27/2015 Elsevier Interactive Patient Education  2018 Elsevier Inc.  

## 2017-10-11 NOTE — Progress Notes (Signed)
Deborah CofferCaroline H Hampton December 05, 1955 [id number]  History: 62 y.o. MBF G2P2 presents for annual exam. 1999 TVH for endometriosis and fibroids on natural HRT [facility]. History of cryo-greater than 20 years ago with normal Paps after. Mammogram history is normal. History of recurrent Candida vulvovaginitis. Colonoscopy positive for 2- polyps will follow-up in 3 years managed [facility]. HTN PCP manages.  09/2016 right partial knee replacment surgery, continues with physical therapy and exercise. 2017 DEXA T score -1.2 at total left forearm, -1.3 at 1/3 left forearm, normal AP spine, without elevated FRAX.  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 2 sons both doing well. Remarried August, 2018. Sexually active in a monogamous relationship with husband. Planning to retire soon. Currently on Keto diet, and does yoga, hiking and palatine bike for exercise.   ROS:  A ROS was performed and pertinent positives and negatives are included.  Exam:  Vitals:   10/11/17 1101  BP: 120/82  Weight: 191 lb (86.6 kg)  Height: 5\' 8"  (1.727 m)   Body mass index is 29.04 kg/m.  General appearance:  Normal Thyroid:  Symmetrical, normal in size, without palpable masses or nodularity. Respiratory  Auscultation:  Clear without wheezing or rhonchi Cardiovascular  Auscultation:  Regular rate, without rubs, murmurs or gallops  Edema/varicosities:  Not grossly evident Abdominal  Soft,nontender, without masses, guarding or rebound.  Liver/spleen:  No organomegaly noted  Hernia:  None appreciated  Skin  Inspection:  Grossly normal   Breasts: Examined lying and sitting.   Right: Without masses, retractions, discharge or axillary adenopathy.  Left: Without masses, retractions, discharge or axillary adenopathy. Gentitourinary   Inguinal/mons:  Normal without  inguinal adenopathy  External genitalia:  Normal  BUS/Urethra/Skene's glands:  Normal  Vagina:  Normal  Cervix: And uterus absent   Adnexa/parametria:     Rt: Without masses or tenderness.   Lt: Without masses or tenderness.  Anus and perineum: Normal             Digital rectal exam:   Normal sphincter tone without palpated masses or  tenderness  Assessment/Plan:  62 y.o. MBF G2P2 presents for annual exam  5299 TVH for endometriosis and fibroids on natural HRT- managed by [facility] Hypertension-primary care manages labs and meds Osteopenia without elevated FRAX History of recurrent yeast  1) Educated and reviewed screening guidelines for DEXA bone scan. Encouraged patient to call and schedule imaging. 2) Encouraged patient to get the Shingrix vaccine from her PCP. 3) Reviewed the importance of increasing exercise and decreasing calories for weight loss. Encouraged to continue low-carb diet, and continuing MVI and Vitamin D 2,000 units daily.  Safety and fall prevention discussed.  Harrington Challengerancy J Young [facility] Services[facility]NP, 11:34 AM 10/11/2017

## 2018-02-27 ENCOUNTER — Encounter: Payer: Self-pay | Admitting: Women's Health

## 2018-05-10 ENCOUNTER — Ambulatory Visit (INDEPENDENT_AMBULATORY_CARE_PROVIDER_SITE_OTHER): Admitting: Gynecology

## 2018-05-10 ENCOUNTER — Encounter: Payer: Self-pay | Admitting: Gynecology

## 2018-05-10 VITALS — BP 120/78

## 2018-05-10 DIAGNOSIS — R3915 Urgency of urination: Secondary | ICD-10-CM

## 2018-05-10 DIAGNOSIS — N898 Other specified noninflammatory disorders of vagina: Secondary | ICD-10-CM

## 2018-05-10 LAB — WET PREP FOR TRICH, YEAST, CLUE

## 2018-05-10 MED ORDER — METRONIDAZOLE 500 MG PO TABS: 500.0000 mg | ORAL_TABLET | Freq: Two times a day (BID) | ORAL | 0 refills | Status: DC

## 2018-05-10 NOTE — Patient Instructions (Signed)
Take the Flagyl medication twice daily for 7 days.  Avoid alcohol while taking.  Follow-up if your symptoms persist, worsen or recur. 

## 2018-05-10 NOTE — Progress Notes (Signed)
    LEASHIA POLCYN Nov 15, 1955 [id number]        63 y.o.  [id number] presents with 1 to 2-week history of vaginal discharge and vaginal irritation.  Some urinary urgency.  No dysuria frequency low back pain fever or chills.  Past medical history,surgical history, problem list, medications, allergies, family history and social history were all reviewed and documented in the EPIC chart.  Directed ROS with pertinent positives and negatives documented in the history of present illness/assessment and plan.  Exam: Kennon Portela assistant Vitals:   05/10/18 1404  BP: 120/78   General appearance:  Normal Abdomen soft nontender without masses guarding rebound Pelvic external BUS vagina with atrophic changes.  White frothy discharge noted.  Bimanual without masses or tenderness.  Assessment/Plan:  63 y.o. [id number] with history and exam as above.  Wet prep is negative.  I suspect she has a bacterial vaginosis.  Options for treatment reviewed and she elects for Flagyl 500 mg twice daily x7 days.  Alcohol avoidance discussed.  Urine analysis shows few bacteria but otherwise unremarkable as far as no white cells or RBCs.  Will culture and treat accordingly if positive.  Patient will follow-up if her symptoms persist, worsen or recur.    Dara Lords MD, 2:11 PM 05/10/2018

## 2018-05-12 LAB — URINALYSIS, COMPLETE W/RFL CULTURE
Bilirubin Urine: NEGATIVE
Glucose, UA: NEGATIVE
HGB URINE DIPSTICK: NEGATIVE
Hyaline Cast: NONE SEEN /LPF
Leukocyte Esterase: NEGATIVE
Nitrites, Initial: NEGATIVE
Protein, ur: NEGATIVE
RBC / HPF: NONE SEEN /HPF (ref 0–2)
Specific Gravity, Urine: 1.025 (ref 1.001–1.03)
pH: 5.5 (ref 5.0–8.0)

## 2018-05-12 LAB — URINE CULTURE
MICRO NUMBER:: 8185
Result:: NO GROWTH
SPECIMEN QUALITY:: ADEQUATE

## 2018-05-12 LAB — CULTURE INDICATED

## 2018-05-28 ENCOUNTER — Ambulatory Visit (INDEPENDENT_AMBULATORY_CARE_PROVIDER_SITE_OTHER): Admitting: Women's Health

## 2018-05-28 ENCOUNTER — Encounter: Payer: Self-pay | Admitting: Women's Health

## 2018-05-28 VITALS — BP 140/78

## 2018-05-28 DIAGNOSIS — B3731 Acute candidiasis of vulva and vagina: Secondary | ICD-10-CM

## 2018-05-28 DIAGNOSIS — B373 Candidiasis of vulva and vagina: Secondary | ICD-10-CM | POA: Diagnosis not present

## 2018-05-28 LAB — WET PREP FOR TRICH, YEAST, CLUE

## 2018-05-28 MED ORDER — FLUCONAZOLE 150 MG PO TABS: 150.0000 mg | ORAL_TABLET | Freq: Once | ORAL | 3 refills | Status: AC

## 2018-05-28 NOTE — Patient Instructions (Signed)
Vaginal Yeast infection, Adult    Vaginal yeast infection is a condition that causes vaginal discharge as well as soreness, swelling, and redness (inflammation) of the vagina. This is a common condition. Some women get this infection frequently.  What are the causes?  This condition is caused by a change in the normal balance of the yeast (candida) and bacteria that live in the vagina. This change causes an overgrowth of yeast, which causes the inflammation.  What increases the risk?  The condition is more likely to develop in women who:   Take antibiotic medicines.   Have diabetes.   Take birth control pills.   Are pregnant.   Douche often.   Have a weak body defense system (immune system).   Have been taking steroid medicines for a long time.   Frequently wear tight clothing.  What are the signs or symptoms?  Symptoms of this condition include:   White, thick, creamy vaginal discharge.   Swelling, itching, redness, and irritation of the vagina. The lips of the vagina (vulva) may be affected as well.   Pain or a burning feeling while urinating.   Pain during sex.  How is this diagnosed?  This condition is diagnosed based on:   Your medical history.   A physical exam.   A pelvic exam. Your health care provider will examine a sample of your vaginal discharge under a microscope. Your health care provider may send this sample for testing to confirm the diagnosis.  How is this treated?  This condition is treated with medicine. Medicines may be over-the-counter or prescription. You may be told to use one or more of the following:   Medicine that is taken by mouth (orally).   Medicine that is applied as a cream (topically).   Medicine that is inserted directly into the vagina (suppository).  Follow these instructions at home:    Lifestyle   Do not have sex until your health care provider approves. Tell your sex partner that you have a yeast infection. That person should go to his or her health care  provider and ask if they should also be treated.   Do not wear tight clothes, such as pantyhose or tight pants.   Wear breathable cotton underwear.  General instructions   Take or apply over-the-counter and prescription medicines only as told by your health care provider.   Eat more yogurt. This may help to keep your yeast infection from returning.   Do not use tampons until your health care provider approves.   Try taking a sitz bath to help with discomfort. This is a warm water bath that is taken while you are sitting down. The water should only come up to your hips and should cover your buttocks. Do this 3-4 times per day or as told by your health care provider.   Do not douche.   If you have diabetes, keep your blood sugar levels under control.   Keep all follow-up visits as told by your health care provider. This is important.  Contact a health care provider if:   You have a fever.   Your symptoms go away and then return.   Your symptoms do not get better with treatment.   Your symptoms get worse.   You have new symptoms.   You develop blisters in or around your vagina.   You have blood coming from your vagina and it is not your menstrual period.   You develop pain in your abdomen.  Summary     Vaginal yeast infection is a condition that causes discharge as well as soreness, swelling, and redness (inflammation) of the vagina.   This condition is treated with medicine. Medicines may be over-the-counter or prescription.   Take or apply over-the-counter and prescription medicines only as told by your health care provider.   Do not douche. Do not have sex or use tampons until your health care provider approves.   Contact a health care provider if your symptoms do not get better with treatment or your symptoms go away and then return.  This information is not intended to replace advice given to you by your health care provider. Make sure you discuss any questions you have with your health care  provider.  Document Released: 02/02/2005 Document Revised: 09/11/2017 Document Reviewed: 09/11/2017  Elsevier Interactive Patient Education  2019 Elsevier Inc.

## 2018-05-28 NOTE — Progress Notes (Signed)
63 y.o.  [id number] presents with 1 to 2-week history of white vaginal discharge with tching. Negative urinary symptoms, low back pain, fever or chills. 05/10/2018 was taking Flagyl 500 mg twice daily for 7 days with no relief for suspected bacterial vaginosis. Days after was treated with Cipro and Flagyl for diverticulitis by primary care. Reports flare up of itching and vaginal discharge after finishing course of antibiotics. Sexually active same partner.  TVH for endometriosis and fibroids [facility]  Exam: Appears wel. Abdomen soft nontender without masses, guarding rebound. External genitalia appear to be mildly atrophic. Speculum exam vaginal walls slightly erythemic, white discharge noted, . No odor noted. Wet Prep positive for yeast.  Yeast vaginitis Resolved abdominal pain/diverticulitis  Plan: Diflucan 150 MG once, with refill.  Instructed to  follow-up if her symptoms persist, worsen or continue.

## 2019-02-05 ENCOUNTER — Encounter: Payer: Self-pay | Admitting: Gynecology

## 2019-03-13 ENCOUNTER — Other Ambulatory Visit: Payer: Self-pay | Admitting: Women's Health

## 2019-04-01 ENCOUNTER — Ambulatory Visit (INDEPENDENT_AMBULATORY_CARE_PROVIDER_SITE_OTHER): Admitting: Women's Health

## 2019-04-01 ENCOUNTER — Encounter: Payer: Self-pay | Admitting: Women's Health

## 2019-04-01 ENCOUNTER — Other Ambulatory Visit: Payer: Self-pay

## 2019-04-01 VITALS — BP 120/82

## 2019-04-01 DIAGNOSIS — R350 Frequency of micturition: Secondary | ICD-10-CM | POA: Diagnosis not present

## 2019-04-01 DIAGNOSIS — N898 Other specified noninflammatory disorders of vagina: Secondary | ICD-10-CM

## 2019-04-01 LAB — WET PREP FOR TRICH, YEAST, CLUE

## 2019-04-01 MED ORDER — PHENAZOPYRIDINE HCL 200 MG PO TABS: 200.0000 mg | ORAL_TABLET | Freq: Three times a day (TID) | ORAL | 0 refills | Status: DC | PRN

## 2019-04-01 NOTE — Progress Notes (Signed)
63 y.o. G6P2 MBF presents with complaint of urinary frequency and urgency that has lasted one month. Reports  occasional low back and abdominal pain. Denies abnormal discharge, fever, chills, nausea, burning and pain with urination. Sexually active same partner. TVH for endometriosis and fibroids. Seen in-office for yeast vaginitis 05/2018.   Exam: Appears well. External genitalia within normal limits. Speculum vaginal exam with slight, white discharge, no odor noted. Wet Prep:negative,  No yeast, clue cells, trichomonas, WBCs. Urinalysis: Negative leukocytes, 0-5 WBC, 0-5 squamous epithelial, few bacteria. No RBC, yeast, casts.  Urinary frequency   Plan: Pyridium 200 mg x1 TID PO PRN for pain. Discussed UTI prevention, encouraged to decrease caffeinated/carbonated beverages. Instructed to call if symptoms continue or worsen.  Urine culture  pending.

## 2019-04-03 LAB — URINALYSIS, COMPLETE W/RFL CULTURE
Bilirubin Urine: NEGATIVE
Glucose, UA: NEGATIVE
Hyaline Cast: NONE SEEN /LPF
Ketones, ur: NEGATIVE
Leukocyte Esterase: NEGATIVE
Nitrites, Initial: NEGATIVE
Protein, ur: NEGATIVE
RBC / HPF: NONE SEEN /HPF (ref 0–2)
Specific Gravity, Urine: 1.03 (ref 1.001–1.03)
pH: 5.5 (ref 5.0–8.0)

## 2019-04-03 LAB — URINE CULTURE
MICRO NUMBER:: 1129346
Result:: NO GROWTH
SPECIMEN QUALITY:: ADEQUATE

## 2019-04-03 LAB — CULTURE INDICATED

## 2019-07-01 ENCOUNTER — Other Ambulatory Visit (HOSPITAL_COMMUNITY): Payer: Self-pay | Admitting: Physician Assistant

## 2019-07-01 ENCOUNTER — Other Ambulatory Visit: Payer: Self-pay | Admitting: Physician Assistant

## 2019-07-01 DIAGNOSIS — M25561 Pain in right knee: Secondary | ICD-10-CM

## 2019-08-07 ENCOUNTER — Other Ambulatory Visit (HOSPITAL_COMMUNITY): Payer: Self-pay | Admitting: Physician Assistant

## 2019-08-07 DIAGNOSIS — Z96651 Presence of right artificial knee joint: Secondary | ICD-10-CM

## 2019-08-07 DIAGNOSIS — M25561 Pain in right knee: Secondary | ICD-10-CM

## 2019-08-21 ENCOUNTER — Other Ambulatory Visit: Payer: Self-pay

## 2019-08-21 ENCOUNTER — Encounter (HOSPITAL_COMMUNITY)
Admission: RE | Admit: 2019-08-21 | Discharge: 2019-08-21 | Disposition: A | Discharge status: Home / Self Care | Source: Ambulatory Visit | Attending: Physician Assistant | Admitting: Physician Assistant

## 2019-08-21 DIAGNOSIS — M25561 Pain in right knee: Secondary | ICD-10-CM | POA: Diagnosis not present

## 2019-08-21 DIAGNOSIS — Z96651 Presence of right artificial knee joint: Secondary | ICD-10-CM | POA: Insufficient documentation

## 2019-08-21 MED ORDER — TECHNETIUM TC 99M MEDRONATE IV KIT
21.4000 | PACK | Freq: Once | INTRAVENOUS | Status: AC | PRN
  Administered 2019-08-21: 11:00:00 21.4 via INTRAVENOUS

## 2019-10-30 ENCOUNTER — Encounter: Admitting: Obstetrics and Gynecology

## 2019-11-08 ENCOUNTER — Encounter: Payer: Self-pay | Admitting: Obstetrics and Gynecology

## 2019-11-08 ENCOUNTER — Other Ambulatory Visit: Payer: Self-pay

## 2019-11-08 ENCOUNTER — Ambulatory Visit (INDEPENDENT_AMBULATORY_CARE_PROVIDER_SITE_OTHER): Admitting: Obstetrics and Gynecology

## 2019-11-08 VITALS — BP 118/76 | Ht 68.0 in | Wt 197.0 lb

## 2019-11-08 DIAGNOSIS — Z01419 Encounter for gynecological examination (general) (routine) without abnormal findings: Secondary | ICD-10-CM | POA: Diagnosis not present

## 2019-11-08 DIAGNOSIS — Z8739 Personal history of other diseases of the musculoskeletal system and connective tissue: Secondary | ICD-10-CM | POA: Diagnosis not present

## 2019-11-08 NOTE — Progress Notes (Signed)
   Deborah Hampton 12-17-1955 [id number]  SUBJECTIVE:  [name] female here for an annual routine gynecologic exam. She has no gynecologic concerns. 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  Current Outpatient Medications  Medication Sig Dispense Refill  . buPROPion (WELLBUTRIN XL) 150 MG 24 hr tablet Take 150 mg by mouth daily.    . Cholecalciferol (VITAMIN D) 2000 UNITS CAPS Take 1 capsule by mouth daily.     . Coenzyme Q10 (CO Q 10) 10 MG CAPS Take 1 tablet by mouth daily.    Marland Kitchen losartan (COZAAR) 25 MG tablet Take 25 mg by mouth daily.    . multivitamin (THERAGRAN) per tablet Take 1 tablet by mouth daily. Reported on 08/26/2015     No current facility-administered medications for this visit.   Allergies: Butalbital-apap-caffeine, Amoxicillin-pot clavulanate, Lisinopril, Nitrofurantoin monohyd macro, Cefuroxime, and Clarithromycin  No LMP recorded. Patient has had a hysterectomy.  Past medical history,surgical history, problem list, medications, allergies, family history and social history were all reviewed and documented as reviewed in the EPIC chart.  ROS:  Feeling well. No dyspnea or chest pain on exertion.  No abdominal pain, change in bowel habits, black or bloody stools.  No urinary tract symptoms. GYN ROS: no abnormal bleeding, pelvic pain or discharge, no breast pain or new or enlarging lumps on self exam. No neurological complaints.   OBJECTIVE:  BP 118/76   Ht 5\' 8"  (1.727 m)   Wt 197 lb (89.4 kg)   BMI 29.95 kg/m  The patient appears well, alert, oriented x 3, in no distress. ENT normal.  Neck supple. No cervical or supraclavicular adenopathy or thyromegaly.  Lungs are clear, good air entry, no wheezes, rhonchi or rales. S1 and S2 normal, no murmurs, regular rate and rhythm.  Abdomen soft without tenderness, guarding, mass or organomegaly.  Neurological is normal, no focal findings.  BREAST EXAM: breasts appear normal, no  suspicious masses, no skin or nipple changes or axillary nodes  PELVIC EXAM: VULVA: normal appearing vulva with no masses, tenderness or lesions, VAGINA: normal appearing vagina with normal color and discharge, no lesions, CERVIX: surgically absent, UTERUS: surgically absent, vaginal cuff normal, ADNEXA: no masses, non tender  Chaperone: present during the examination  ASSESSMENT:  64 y.o. 69 here for annual gynecologic exam  PLAN:   1. Postmenopausal. Prior [facility] 1999 for endometriosis and fibroids.  On 'natural' HRT by [facility]. Doing okay with that. 2. Pap smear 2012.  Comfortable with not screening based on previous hysterectomy in following current guidelines. 3. Mammogram 02/2019.  Normal breast exam today.  She is reminded to schedule an annual mammogram this year when due. 4. Colonoscopy 2019.  Recommended that she follow up at the recommended interval.   5. Osteopenia. DEXA 2017.  T score -1.3.  Next DEXA recommended to her and the test is ordered.  She will think about when she would like to do this.   6. Health maintenance.  No labs today as she normally has these completed elsewhere.  Hypertension and medications for mood are managed by her primary care doctor.  Return annually or sooner, prn.  2018 MD 11/08/19

## 2020-05-14 ENCOUNTER — Other Ambulatory Visit: Payer: Self-pay

## 2020-05-14 ENCOUNTER — Encounter: Payer: Self-pay | Admitting: Obstetrics and Gynecology

## 2020-05-14 ENCOUNTER — Ambulatory Visit (INDEPENDENT_AMBULATORY_CARE_PROVIDER_SITE_OTHER): Admitting: Obstetrics and Gynecology

## 2020-05-14 VITALS — BP 138/80

## 2020-05-14 DIAGNOSIS — N898 Other specified noninflammatory disorders of vagina: Secondary | ICD-10-CM

## 2020-05-14 LAB — WET PREP FOR TRICH, YEAST, CLUE

## 2020-05-14 MED ORDER — FLUCONAZOLE 150 MG PO TABS: 150.0000 mg | ORAL_TABLET | ORAL | 0 refills | Status: AC

## 2020-05-14 NOTE — Progress Notes (Signed)
   Deborah Hampton September 01, 1955 [id number]  SUBJECTIVE:  65 y.o. [id number] female presents for 1 week of vaginal discharge and internal vaginal itching.  This started after she took antibiotics for a sinus infection.  She has periodically had vaginal yeast infections in the past.  She did try a 1 night Monistat treatment OTC without any improvement in her symptoms.  No bladder/dysuria concerns.  Current Outpatient Medications  Medication Sig Dispense Refill  . buPROPion (WELLBUTRIN XL) 150 MG 24 hr tablet Take 150 mg by mouth daily.    . Cholecalciferol (VITAMIN D) 2000 UNITS CAPS Take 1 capsule by mouth daily.    . Coenzyme Q10 (CO Q 10) 10 MG CAPS Take 1 tablet by mouth daily.    Marland Kitchen losartan (COZAAR) 25 MG tablet Take 25 mg by mouth daily.    . multivitamin (THERAGRAN) per tablet Take 1 tablet by mouth daily. Reported on 08/26/2015     No current facility-administered medications for this visit.   Allergies: Butalbital-apap-caffeine, Amoxicillin-pot clavulanate, Lisinopril, Nitrofurantoin monohyd macro, Cefuroxime, and Clarithromycin  No LMP recorded. Patient has had a hysterectomy.  Past medical history,surgical history, problem list, medications, allergies, family history and social history were all reviewed and documented as reviewed in the EPIC chart.  ROS: Pertinent positives and negatives as reviewed in HPI   OBJECTIVE:  BP 138/80 (BP Location: Right Arm, Patient Position: Sitting, Cuff Size: Normal)  The patient appears well, alert, oriented x 3, in no distress. PELVIC EXAM: VULVA: normal appearing vulva with no masses, tenderness or lesions, VAGINA: normal appearing vagina with normal color and moderate thick white curdled discharge, no lesions, CERVIX: absent, ADNEXA: normal adnexa in size, nontender and no masses, WET MOUNT done - results: negative for pathogens, normal epithelial cells  Chaperone: Bari Mantis Bonham present during the examination  ASSESSMENT:  65 y.o. [phone]  with presumed vaginal candidiasis  PLAN:  Given the clinical scenario, and despite the negative wet mount, the patient's symptoms and physical examination suggestive of vaginal candidiasis.  We will treat empirically accordingly with fluconazole 150 mg p.o. every 72 hours x2 doses.  Patient will follow-up for any ongoing symptoms or concerns.   Theresia Majors MD 05/14/20

## 2020-11-11 NOTE — Progress Notes (Addendum)
PCP - Clearance Herma Carson, MD on chart  Cardiologist - no  PPM/ICD -  Device Orders -  Rep Notified -   Chest x-ray -  EKG -  Stress Test -  ECHO -  Cardiac Cath -   Sleep Study -  CPAP -   Fasting Blood Sugar -  Checks Blood Sugar _____ times a day  Blood Thinner Instructions: Aspirin Instructions:  ERAS Protcol - PRE-SURGERY Ensure or G2-   COVID TEST- 11-30-20  Activity-Able to walk a flight of stairs without SOB Anesthesia review: HTN  Patient denies shortness of breath, fever, cough and chest pain at PAT appointment   All instructions explained to the patient, with a verbal understanding of the material. Patient agrees to go over the instructions while at home for a better understanding. Patient also instructed to self quarantine after being tested for COVID-19. The opportunity to ask questions was provided.

## 2020-11-11 NOTE — Patient Instructions (Addendum)
DUE TO COVID-19 ONLY ONE VISITOR IS ALLOWED TO COME WITH YOU AND STAY IN THE WAITING ROOM ONLY DURING PRE OP AND PROCEDURE DAY OF SURGERY.   TWO VISITOR  MAY VISIT WITH YOU AFTER SURGERY IN YOUR PRIVATE ROOM DURING VISITING HOURS ONLY!  YOU NEED TO HAVE A COVID 19 TEST ON___7-25-22____ @_______ , THIS TEST MUST BE DONE BEFORE SURGERY,    COVID TESTING SITE [address] [place] [id number],   IT IS ON THE RIGHT GOING OUT [address] APPROXIMATELY  2 MINUTES PAST [employer] ON THE RIGHT. ONCE YOUR COVID TEST IS COMPLETED,  PLEASE Wear a mask when in public           Your procedure is scheduled on: 12-04-20   [facility]   Report to admitting at     0640  AM     Call this number if you have problems the morning of surgery [phone]    Remember: NO SOLID FOOD AFTER MIDNIGHT THE NIGHT PRIOR TO SURGERY. NOTHING BY MOUTH EXCEPT CLEAR LIQUIDS UNTIL      0600 am .   PLEASE FINISH ENSURE DRINK PER SURGEON ORDER  WHICH NEEDS TO BE COMPLETED AT        0600  am then nothing by mouth.      CLEAR LIQUID DIET   Foods Allowed                                                                     Foods Excluded water Black Coffee and tea, regular and decaf                             liquids that you cannot  Plain Jell-O any favor except red or purple                                           see through such as: Fruit ices (not with fruit pulp)                                                    milk, soups, orange juice  Iced Popsicles                                                       All solid food Carbonated beverages, regular and diet                                    Cranberry, grape and apple juices Sports drinks like Gatorade Lightly seasoned clear broth or consume(fat free) Sugar, honey syrup  _____________________________________________________________________     BRUSH YOUR TEETH MORNING OF SURGERY AND RINSE YOUR  MOUTH OUT, NO CHEWING GUM CANDY OR MINTS.     Take these medicines the morning of surgery with A SIP OF WATER: none                                 You may not have any metal on your body including hair pins and              piercings  Do not wear jewelry, make-up, lotions, powders or perfumes, deodorant             Do not wear nail polish on your fingernails or toenails .  Do not shave  48 hours prior to surgery.                Do not bring valuables to the hospital. [facility] IS NOT             RESPONSIBLE   FOR VALUABLES.  Contacts, dentures or bridgework may not be worn into surgery.       Patients discharged the day of surgery will not be allowed to drive home. IF YOU ARE HAVING SURGERY AND GOING HOME THE SAME DAY, YOU MUST HAVE AN ADULT TO DRIVE YOU HOME AND BE WITH YOU FOR 24 HOURS. YOU MAY GO HOME BY TAXI OR UBER OR ORTHERWISE, BUT AN ADULT MUST ACCOMPANY YOU HOME AND STAY WITH YOU FOR 24 HOURS.  Name and phone number of your driver:  Special Instructions: N/A              Please read over the following fact sheets you were given: _____________________________________________________________________             [facility] - Preparing for Surgery Before surgery, you can play an important role.  Because skin is not sterile, your skin needs to be as free of germs as possible.  You can reduce the number of germs on your skin by washing with CHG (chlorahexidine gluconate) soap before surgery.  CHG is an antiseptic cleaner which kills germs and bonds with the skin to continue killing germs even after washing. Please DO NOT use if you have an allergy to CHG or antibacterial soaps.  If your skin becomes reddened/irritated stop using the CHG and inform your nurse when you arrive at Short Stay. Do not shave (including legs and underarms) for at least 48 hours prior to the first CHG shower.  You may shave your face/neck. Please follow these instructions carefully:  1.  Shower with CHG  Soap the night before surgery and the  morning of Surgery.  2.  If you choose to wash your hair, wash your hair first as usual with your  normal  shampoo.  3.  After you shampoo, rinse your hair and body thoroughly to remove the  shampoo.                           4.  Use CHG as you would any other liquid soap.  You can apply chg directly  to the skin and wash                       Gently with a scrungie or clean washcloth.  5.  Apply the CHG Soap to your body ONLY FROM THE NECK DOWN.   Do not use on face/ open  Wound or open sores. Avoid contact with eyes, ears mouth and genitals (private parts).                       Wash face,  Genitals (private parts) with your normal soap.             6.  Wash thoroughly, paying special attention to the area where your surgery  will be performed.  7.  Thoroughly rinse your body with warm water from the neck down.  8.  DO NOT shower/wash with your normal soap after using and rinsing off  the CHG Soap.                9.  Pat yourself dry with a clean towel.            10.  Wear clean pajamas.            11.  Place clean sheets on your bed the night of your first shower and do not  sleep with pets. Day of Surgery : Do not apply any lotions/deodorants the morning of surgery.  Please wear clean clothes to the hospital/surgery center.  FAILURE TO FOLLOW THESE INSTRUCTIONS MAY RESULT IN THE CANCELLATION OF YOUR SURGERY PATIENT SIGNATURE_________________________________  NURSE SIGNATURE__________________________________  ______  Rogelia Mire  An incentive spirometer is a tool that can help keep your lungs clear and active. This tool measures how well you are filling your lungs with each breath. Taking long deep breaths may help reverse or decrease the chance of developing breathing (pulmonary) problems (especially infection) following: A long period of time when you are unable to move or be active. BEFORE THE PROCEDURE  If  the spirometer includes an indicator to show your best effort, your nurse or respiratory therapist will set it to a desired goal. If possible, sit up straight or lean slightly forward. Try not to slouch. Hold the incentive spirometer in an upright position. INSTRUCTIONS FOR USE  Sit on the edge of your bed if possible, or sit up as far as you can in bed or on a chair. Hold the incentive spirometer in an upright position. Breathe out normally. Place the mouthpiece in your mouth and seal your lips tightly around it. Breathe in slowly and as deeply as possible, raising the piston or the ball toward the top of the column. Hold your breath for 3-5 seconds or for as long as possible. Allow the piston or ball to fall to the bottom of the column. Remove the mouthpiece from your mouth and breathe out normally. Rest for a few seconds and repeat Steps 1 through 7 at least 10 times every 1-2 hours when you are awake. Take your time and take a few normal breaths between deep breaths. The spirometer may include an indicator to show your best effort. Use the indicator as a goal to work toward during each repetition. After each set of 10 deep breaths, practice coughing to be sure your lungs are clear. If you have an incision (the cut made at the time of surgery), support your incision when coughing by placing a pillow or rolled up towels firmly against it. Once you are able to get out of bed, walk around indoors and cough well. You may stop using the incentive spirometer when instructed by your caregiver.  RISKS AND COMPLICATIONS Take your time so you do not get dizzy or light-headed. If you are in pain, you may need to take or ask for  pain medication before doing incentive spirometry. It is harder to take a deep breath if you are having pain. AFTER USE Rest and breathe slowly and easily. It can be helpful to keep track of a log of your progress. Your caregiver can provide you with a simple table to help with  this. If you are using the spirometer at home, follow these instructions: SEEK MEDICAL CARE IF:  You are having difficultly using the spirometer. You have trouble using the spirometer as often as instructed. Your pain medication is not giving enough relief while using the spirometer. You develop fever of 100.5 F (38.1 C) or higher. SEEK IMMEDIATE MEDICAL CARE IF:  You cough up bloody sputum that had not been present before. You develop fever of 102 F (38.9 C) or greater. You develop worsening pain at or near the incision site. MAKE SURE YOU:  Understand these instructions. Will watch your condition. Will get help right away if you are not doing well or get worse. Document Released: 09/05/2006 Document Revised: 07/18/2011 Document Reviewed: 11/06/2006 [facility] Patient Information 2014 [employer], [place].   ________________________________________________________________________ __________________________________________________________________

## 2020-11-18 ENCOUNTER — Other Ambulatory Visit (HOSPITAL_COMMUNITY)

## 2020-11-25 ENCOUNTER — Other Ambulatory Visit: Payer: Self-pay

## 2020-11-25 ENCOUNTER — Encounter (HOSPITAL_COMMUNITY)
Admission: RE | Admit: 2020-11-25 | Discharge: 2020-11-25 | Disposition: A | Discharge status: Home / Self Care | Source: Ambulatory Visit | Attending: Orthopedic Surgery | Admitting: Orthopedic Surgery

## 2020-11-25 ENCOUNTER — Encounter (HOSPITAL_COMMUNITY): Payer: Self-pay

## 2020-11-25 DIAGNOSIS — Z01818 Encounter for other preprocedural examination: Secondary | ICD-10-CM | POA: Diagnosis not present

## 2020-11-25 HISTORY — DX: Unspecified osteoarthritis, unspecified site: M19.90

## 2020-11-25 LAB — COMPREHENSIVE METABOLIC PANEL
ALT: 17 U/L (ref 0–44)
AST: 17 U/L (ref 15–41)
Albumin: 4.3 g/dL (ref 3.5–5.0)
Alkaline Phosphatase: 65 U/L (ref 38–126)
Anion gap: 7 (ref 5–15)
BUN: 14 mg/dL (ref 8–23)
CO2: 25 mmol/L (ref 22–32)
Calcium: 9.6 mg/dL (ref 8.9–10.3)
Chloride: 107 mmol/L (ref 98–111)
Creatinine, Ser: 0.7 mg/dL (ref 0.44–1.00)
GFR, Estimated: >60 mL/min (ref >60)
Glucose, Bld: 95 mg/dL (ref 70–99)
Potassium: 4.1 mmol/L (ref 3.5–5.1)
Sodium: 139 mmol/L (ref 135–145)
Total Bilirubin: 1.3 mg/dL — ABNORMAL HIGH (ref 0.3–1.2)
Total Protein: 7.2 g/dL (ref 6.5–8.1)

## 2020-11-25 LAB — CBC
HCT: 43.2 % (ref 36.0–46.0)
Hemoglobin: 14.7 g/dL (ref 12.0–15.0)
MCH: 31.7 pg (ref 26.0–34.0)
MCHC: 34 g/dL (ref 30.0–36.0)
MCV: 93.3 fL (ref 80.0–100.0)
Platelets: 318 10*3/uL (ref 150–400)
RBC: 4.63 MIL/uL (ref 3.87–5.11)
RDW: 13 % (ref 11.5–15.5)
WBC: 5.8 10*3/uL (ref 4.0–10.5)
nRBC: 0 % (ref 0.0–0.2)

## 2020-11-25 LAB — SURGICAL PCR SCREEN
MRSA, PCR: NEGATIVE
Staphylococcus aureus: NEGATIVE

## 2020-11-25 LAB — APTT: aPTT: 33 seconds (ref 24–36)

## 2020-11-25 LAB — PROTIME-INR
INR: 0.9 (ref 0.8–1.2)
Prothrombin Time: 12.5 seconds (ref 11.4–15.2)

## 2020-11-30 ENCOUNTER — Other Ambulatory Visit (HOSPITAL_COMMUNITY)
Admission: RE | Admit: 2020-11-30 | Discharge: 2020-11-30 | Disposition: A | Discharge status: Home / Self Care | Payer: Self-pay | Source: Ambulatory Visit | Attending: Orthopedic Surgery | Admitting: Orthopedic Surgery

## 2020-11-30 DIAGNOSIS — Z20822 Contact with and (suspected) exposure to covid-19: Secondary | ICD-10-CM | POA: Insufficient documentation

## 2020-11-30 DIAGNOSIS — Z01812 Encounter for preprocedural laboratory examination: Secondary | ICD-10-CM | POA: Insufficient documentation

## 2020-11-30 LAB — SARS CORONAVIRUS 2 (TAT 6-24 HRS): SARS Coronavirus 2: NEGATIVE

## 2020-12-01 MED ORDER — BUPIVACAINE LIPOSOME 1.3 % IJ SUSP
20.0000 mL | INTRAMUSCULAR | Status: DC
  Filled 2020-12-01: qty 20

## 2020-12-01 NOTE — H&P (Signed)
TOTAL KNEE ADMISSION H&P  Patient is being admitted for left total knee arthroplasty.  Subjective:  Chief Complaint: Left knee pain.  HPI: Deborah Hampton, 65 y.o. female has a history of pain and functional disability in the left knee due to arthritis and has failed non-surgical conservative treatments for greater than 12 weeks to include NSAID's and/or analgesics, corticosteriod injections, and activity modification. Onset of symptoms was gradual, starting  several  years ago with gradually worsening course since that time. The patient noted no past surgery on the left knee.  Patient currently rates pain in the left knee at 6 out of 10 with activity. Patient has worsening of pain with activity and weight bearing, pain that interferes with activities of daily living, pain with passive range of motion, and crepitus. Patient has evidence of periarticular osteophytes and joint space narrowing by imaging studies. There is no active infection.  Patient Active Problem List   Diagnosis Date Noted   Hypertension     Past Medical History:  Diagnosis Date   Arthritis    Hypertension    Osteopenia 12/2015   T score -1.3    Past Surgical History:  Procedure Laterality Date   ECTOPIC PREGNANCY SURGERY     lso   PARTIAL KNEE ARTHROPLASTY Right 2018   TOTAL HIP ARTHROPLASTY     X 2   TUBAL LIGATION     VAGINAL HYSTERECTOMY      Prior to Admission medications   Medication Sig Start Date End Date Taking? Authorizing Provider  Cholecalciferol (VITAMIN D3 PO) Take by mouth.   Yes [provider]  Coenzyme Q10 (COQ10 PO) Take 1 tablet by mouth in the morning.   Yes [provider]  losartan (COZAAR) 50 MG tablet Take 50 mg by mouth in the morning.   Yes [provider]  Multiple Vitamin (MULTIVITAMIN WITH MINERALS) TABS tablet Take 1 tablet by mouth in the morning.   Yes [provider]  VITAMIN E PO Take 1 capsule by mouth in the morning.   Yes [provider]    Allergies  Allergen Reactions   Butalbital-Apap-Caffeine Other (See Comments)    insomnia   Amoxicillin-Pot Clavulanate Diarrhea   Lisinopril     Cough   Nitrofurantoin Monohyd Macro Diarrhea   Cefuroxime Nausea Only   Clarithromycin Nausea Only and Other (See Comments)    GI Upset    Social History   Socioeconomic History   Marital status: Married    Spouse name: Not on file   Number of children: Not on file   Years of education: Not on file   Highest education level: Not on file  Occupational History   Not on file  Tobacco Use   Smoking status: Never   Smokeless tobacco: Never  Vaping Use   Vaping Use: Never used  Substance and Sexual Activity   Alcohol use: Yes    Alcohol/week: 0.0 standard drinks    Comment: Rare   Drug use: No   Sexual activity: Yes    Birth control/protection: Surgical    Comment: HYST, INTERCOURSE AGE 53, SEXUAL PARTNERS MORE THAN 5  Other Topics Concern   Not on file  Social History Narrative   Not on file   Social Determinants of Health   Financial Resource Strain: Not on file  Food Insecurity: Not on file  Transportation Needs: Not on file  Physical Activity: Not on file  Stress: Not on file  Social Connections: Not on file  Intimate Partner Violence: Not on file    Tobacco Use: Low Risk    Smoking Tobacco Use: Never   Smokeless Tobacco Use: Never   Social History   Substance and Sexual Activity  Alcohol Use Yes   Alcohol/week: 0.0 standard drinks   Comment: Rare    Family History  Problem Relation Age of Onset   Hypertension Mother    Diabetes Father    Hypertension Father    Breast cancer Sister        Age 7   Hypertension Sister    Breast cancer Maternal Aunt        Age 71's    ROS: Constitutional: no fever, no chills, no night sweats, no significant weight loss Cardiovascular: no chest pain, no palpitations Respiratory: no cough, no shortness of breath, No COPD Gastrointestinal: no  vomiting, no nausea Musculoskeletal: no swelling in Joints, Joint Pain Neurologic: no numbness, no tingling, no difficulty with balance   Objective:  Physical Exam: General: Alert and oriented x3, cooperative and pleasant, no acute distress.  Head: normocephalic, atraumatic, neck supple.  Eyes: EOMI.  Respiratory: breath sounds clear in all fields, no wheezing, rales, or rhonchi. Cardiovascular: Regular rate and rhythm, no murmurs, gallops or rubs.  Abdomen: non-tender to palpation and soft, normoactive bowel sounds. Musculoskeletal:  Right Knee Exam:   No effusion present. No swelling present.   The range of motion is: 0 to 125 degrees.   No crepitus on range of motion of the knee.   Slight medial joint line tenderness.   No lateral joint line tenderness.   The knee is stable.       Left Knee Exam:   No effusion present. No swelling present. Slight varus deformity.   The Range of motion is: 0 to 130 degrees.   No crepitus on range of motion of the knee.   Significant medial joint line tenderness   No lateral joint line tenderness.   The knee is stable.   Calves soft and nontender. Motor function intact in LE. Strength 5/5 LE bilaterally. Neuro: Distal pulses 2+. Sensation to light touch intact in LE.   Vital signs in last 24 hours:    Imaging Review X-rays of her knees show the unicompartmental arthroplasty medially on the right knee with moderate patellofemoral change. The left knee shows tricompartmental osteoarthritic change.  Assessment/Plan:  End stage arthritis, left knee   The patient history, physical examination, clinical judgment of the provider and imaging studies are consistent with end stage degenerative joint disease of the left knee and total knee arthroplasty is deemed medically necessary. The treatment options including medical management, injection therapy arthroscopy and arthroplasty were discussed at length. The risks and benefits of total knee  arthroplasty were presented and reviewed. The risks due to aseptic loosening, infection, stiffness, patella tracking problems, thromboembolic complications and other imponderables were discussed. The patient acknowledged the explanation, agreed to proceed with the plan and consent was signed. Patient is being admitted for inpatient treatment for surgery, pain control, PT, OT, prophylactic antibiotics, VTE prophylaxis, progressive ambulation and ADLs and discharge planning. The patient is planning to be discharged  home .   Patient's anticipated LOS is less than 2 midnights, meeting these requirements: - Younger than 65 - Lives within 1 hour of care - Has a competent adult at home to recover with post-op  - NO history of  - Chronic pain requiring opioids  - Diabetes  - Coronary Artery Disease  - Heart failure  -  Heart attack  - Stroke  - DVT/VTE  - Cardiac arrhythmia  - Respiratory Failure/COPD  - Renal failure  - Anemia  - Advanced Liver disease   Therapy Plans: [place] [facility] Disposition: Home with Husband Planned DVT Prophylaxis: Xarelto 10mg  (intolerant to Aspirin) DME Needed: None PCP: , MD - Clearance received TXA: IV Allergies: Butabarbital (anaphylaxis), Butalbital (anaphylaxis), Cefuroxime (nausea), Clarithromycin (nausea), Lisinopril (anaphylaxis), Nitrofurantoin (diarrhea) Anesthesia Concerns: Has had one episode where she had significant nausea following her UKA surgery, that lasted for days.  BMI: 28.9 Last HgbA1c: N/A  Pharmacy: [facility] on [address] in [place]  Other: Hydrocodone ineffective following UKA.    - Patient was instructed on what medications to stop prior to surgery. - Follow-up visit in 2 weeks with Dr. Uralaane - Begin physical therapy following surgery - Pre-operative lab work as pre-surgical testing - Prescriptions will be provided in hospital at time of discharge  Lequita Halt, [facility], PA-C Orthopedic  Surgery [facility] [place]

## 2020-12-02 ENCOUNTER — Observation Stay (HOSPITAL_COMMUNITY)
Admission: RE | Admit: 2020-12-02 | Discharge: 2020-12-04 | Disposition: A | Discharge status: Home / Self Care | Attending: Orthopedic Surgery | Admitting: Orthopedic Surgery

## 2020-12-02 ENCOUNTER — Ambulatory Visit (HOSPITAL_COMMUNITY): Admitting: Anesthesiology

## 2020-12-02 ENCOUNTER — Inpatient Hospital Stay: Admit: 2020-12-02 | Admitting: Orthopedic Surgery

## 2020-12-02 ENCOUNTER — Encounter (HOSPITAL_COMMUNITY): Payer: Self-pay | Admitting: Orthopedic Surgery

## 2020-12-02 ENCOUNTER — Encounter (HOSPITAL_COMMUNITY)
Admission: RE | Disposition: A | Discharge status: Home / Self Care | Payer: Self-pay | Source: Home / Self Care | Attending: Orthopedic Surgery

## 2020-12-02 DIAGNOSIS — M1712 Unilateral primary osteoarthritis, left knee: Secondary | ICD-10-CM | POA: Diagnosis not present

## 2020-12-02 DIAGNOSIS — M1710 Unilateral primary osteoarthritis, unspecified knee: Secondary | ICD-10-CM | POA: Diagnosis present

## 2020-12-02 DIAGNOSIS — Z96651 Presence of right artificial knee joint: Secondary | ICD-10-CM | POA: Insufficient documentation

## 2020-12-02 DIAGNOSIS — M179 Osteoarthritis of knee, unspecified: Secondary | ICD-10-CM

## 2020-12-02 DIAGNOSIS — I10 Essential (primary) hypertension: Secondary | ICD-10-CM | POA: Insufficient documentation

## 2020-12-02 DIAGNOSIS — Z79899 Other long term (current) drug therapy: Secondary | ICD-10-CM | POA: Insufficient documentation

## 2020-12-02 HISTORY — PX: TOTAL KNEE ARTHROPLASTY: SHX125

## 2020-12-02 LAB — TYPE AND SCREEN
ABO/RH(D): O POS
Antibody Screen: NEGATIVE

## 2020-12-02 LAB — ABO/RH: ABO/RH(D): O POS

## 2020-12-02 SURGERY — CONVERSION, ARTHROPLASTY, KNEE, PARTIAL, TO TOTAL KNEE ARTHROPLASTY
Anesthesia: Choice | Site: Knee | Laterality: Right

## 2020-12-02 SURGERY — ARTHROPLASTY, KNEE, TOTAL
Anesthesia: Spinal | Site: Knee | Laterality: Left

## 2020-12-02 MED ORDER — FENTANYL CITRATE (PF) 100 MCG/2ML IJ SOLN
INTRAMUSCULAR | Status: DC | PRN
  Administered 2020-12-02 (×2): 50 ug via INTRAVENOUS

## 2020-12-02 MED ORDER — METHOCARBAMOL 500 MG IVPB - SIMPLE MED
INTRAVENOUS | Status: AC
  Filled 2020-12-02: qty 50

## 2020-12-02 MED ORDER — SODIUM CHLORIDE (PF) 0.9 % IJ SOLN
INTRAMUSCULAR | Status: AC
  Filled 2020-12-02: qty 10

## 2020-12-02 MED ORDER — FENTANYL CITRATE (PF) 100 MCG/2ML IJ SOLN
25.0000 ug | INTRAMUSCULAR | Status: DC | PRN
  Administered 2020-12-02: 50 ug via INTRAVENOUS

## 2020-12-02 MED ORDER — ROPIVACAINE HCL 5 MG/ML IJ SOLN
INTRAMUSCULAR | Status: DC | PRN
  Administered 2020-12-02: 20 mL via PERINEURAL

## 2020-12-02 MED ORDER — GABAPENTIN 300 MG PO CAPS
300.0000 mg | ORAL_CAPSULE | Freq: Three times a day (TID) | ORAL | Status: DC
  Administered 2020-12-02 – 2020-12-04 (×7): 300 mg via ORAL
  Filled 2020-12-02 (×7): qty 1

## 2020-12-02 MED ORDER — ONDANSETRON HCL 4 MG PO TABS: 4.0000 mg | ORAL_TABLET | Freq: Four times a day (QID) | ORAL | Status: DC | PRN

## 2020-12-02 MED ORDER — HYDROMORPHONE HCL 1 MG/ML IJ SOLN
0.5000 mg | INTRAMUSCULAR | Status: DC | PRN
  Administered 2020-12-02 – 2020-12-04 (×9): 1 mg via INTRAVENOUS
  Filled 2020-12-02 (×9): qty 1

## 2020-12-02 MED ORDER — RIVAROXABAN 10 MG PO TABS
10.0000 mg | ORAL_TABLET | Freq: Every day | ORAL | Status: DC
  Administered 2020-12-03 – 2020-12-04 (×2): 10 mg via ORAL
  Filled 2020-12-02 (×2): qty 1

## 2020-12-02 MED ORDER — DEXAMETHASONE SODIUM PHOSPHATE 10 MG/ML IJ SOLN
10.0000 mg | Freq: Once | INTRAMUSCULAR | Status: AC
  Administered 2020-12-03: 10 mg via INTRAVENOUS
  Filled 2020-12-02: qty 1

## 2020-12-02 MED ORDER — POLYETHYLENE GLYCOL 3350 17 G PO PACK: 17.0000 g | PACK | Freq: Every day | ORAL | Status: DC | PRN

## 2020-12-02 MED ORDER — MIDAZOLAM HCL 2 MG/2ML IJ SOLN
1.0000 mg | INTRAMUSCULAR | Status: DC
  Administered 2020-12-02: 1 mg via INTRAVENOUS
  Filled 2020-12-02: qty 2

## 2020-12-02 MED ORDER — LACTATED RINGERS IV SOLN: INTRAVENOUS | Status: DC

## 2020-12-02 MED ORDER — SODIUM CHLORIDE (PF) 0.9 % IJ SOLN
INTRAMUSCULAR | Status: DC | PRN
  Administered 2020-12-02: 60 mL

## 2020-12-02 MED ORDER — HYDROMORPHONE HCL 2 MG PO TABS
2.0000 mg | ORAL_TABLET | ORAL | Status: DC | PRN
  Administered 2020-12-02: 2 mg via ORAL
  Administered 2020-12-03 – 2020-12-04 (×5): 4 mg via ORAL
  Filled 2020-12-02: qty 2
  Filled 2020-12-02: qty 1
  Filled 2020-12-02 (×5): qty 2

## 2020-12-02 MED ORDER — FLEET ENEMA 7-19 GM/118ML RE ENEM: 1.0000 | ENEMA | Freq: Once | RECTAL | Status: DC | PRN

## 2020-12-02 MED ORDER — AMISULPRIDE (ANTIEMETIC) 5 MG/2ML IV SOLN: 10.0000 mg | Freq: Once | INTRAVENOUS | Status: DC | PRN

## 2020-12-02 MED ORDER — BISACODYL 10 MG RE SUPP: 10.0000 mg | Freq: Every day | RECTAL | Status: DC | PRN

## 2020-12-02 MED ORDER — DEXAMETHASONE SODIUM PHOSPHATE 10 MG/ML IJ SOLN: 8.0000 mg | Freq: Once | INTRAMUSCULAR | Status: DC

## 2020-12-02 MED ORDER — TRAMADOL HCL 50 MG PO TABS
50.0000 mg | ORAL_TABLET | Freq: Four times a day (QID) | ORAL | Status: DC | PRN
  Administered 2020-12-02 – 2020-12-03 (×3): 100 mg via ORAL
  Administered 2020-12-04: 50 mg via ORAL
  Administered 2020-12-04: 100 mg via ORAL
  Filled 2020-12-02: qty 2
  Filled 2020-12-02: qty 1
  Filled 2020-12-02 (×3): qty 2

## 2020-12-02 MED ORDER — OXYCODONE HCL 5 MG PO TABS
5.0000 mg | ORAL_TABLET | ORAL | Status: DC | PRN
  Administered 2020-12-02: 10 mg via ORAL

## 2020-12-02 MED ORDER — STERILE WATER FOR IRRIGATION IR SOLN
Status: DC | PRN
  Administered 2020-12-02 (×2): 1000 mL

## 2020-12-02 MED ORDER — FENTANYL CITRATE (PF) 100 MCG/2ML IJ SOLN
INTRAMUSCULAR | Status: AC
  Filled 2020-12-02: qty 2

## 2020-12-02 MED ORDER — FENTANYL CITRATE (PF) 100 MCG/2ML IJ SOLN
50.0000 ug | INTRAMUSCULAR | Status: DC
  Administered 2020-12-02: 50 ug via INTRAVENOUS
  Filled 2020-12-02: qty 2

## 2020-12-02 MED ORDER — ORAL CARE MOUTH RINSE: 15.0000 mL | Freq: Once | OROMUCOSAL | Status: AC

## 2020-12-02 MED ORDER — ONDANSETRON HCL 4 MG/2ML IJ SOLN
4.0000 mg | Freq: Four times a day (QID) | INTRAMUSCULAR | Status: DC | PRN
  Administered 2020-12-02: 4 mg via INTRAVENOUS
  Filled 2020-12-02: qty 2

## 2020-12-02 MED ORDER — BUPIVACAINE LIPOSOME 1.3 % IJ SUSP
INTRAMUSCULAR | Status: DC | PRN
  Administered 2020-12-02: 20 mL

## 2020-12-02 MED ORDER — PROPOFOL 500 MG/50ML IV EMUL
INTRAVENOUS | Status: DC | PRN
  Administered 2020-12-02: 30 ug/kg/min via INTRAVENOUS
  Administered 2020-12-02: 50 ug/kg/min via INTRAVENOUS

## 2020-12-02 MED ORDER — POVIDONE-IODINE 10 % EX SWAB
2.0000 "application " | Freq: Once | CUTANEOUS | Status: AC
  Administered 2020-12-02: 2 via TOPICAL

## 2020-12-02 MED ORDER — METHOCARBAMOL 500 MG PO TABS
500.0000 mg | ORAL_TABLET | Freq: Four times a day (QID) | ORAL | Status: DC | PRN
  Administered 2020-12-02: 500 mg via ORAL
  Filled 2020-12-02: qty 1

## 2020-12-02 MED ORDER — LOSARTAN POTASSIUM 50 MG PO TABS
50.0000 mg | ORAL_TABLET | Freq: Every day | ORAL | Status: DC
  Administered 2020-12-03 – 2020-12-04 (×2): 50 mg via ORAL
  Filled 2020-12-02 (×3): qty 1

## 2020-12-02 MED ORDER — PROPOFOL 10 MG/ML IV BOLUS
INTRAVENOUS | Status: DC | PRN
  Administered 2020-12-02: 50 mg via INTRAVENOUS

## 2020-12-02 MED ORDER — CHLORHEXIDINE GLUCONATE 0.12 % MT SOLN
15.0000 mL | Freq: Once | OROMUCOSAL | Status: AC
  Administered 2020-12-02: 15 mL via OROMUCOSAL

## 2020-12-02 MED ORDER — SODIUM CHLORIDE 0.9 % IV SOLN
2.0000 g | INTRAVENOUS | Status: AC
  Administered 2020-12-02: 2 g via INTRAVENOUS
  Filled 2020-12-02: qty 2

## 2020-12-02 MED ORDER — GLYCOPYRROLATE PF 0.2 MG/ML IJ SOSY
PREFILLED_SYRINGE | INTRAMUSCULAR | Status: DC | PRN
  Administered 2020-12-02: .2 mg via INTRAVENOUS

## 2020-12-02 MED ORDER — ONDANSETRON HCL 4 MG/2ML IJ SOLN
INTRAMUSCULAR | Status: DC | PRN
  Administered 2020-12-02: 4 mg via INTRAVENOUS

## 2020-12-02 MED ORDER — PHENOL 1.4 % MT LIQD: 1.0000 | OROMUCOSAL | Status: DC | PRN

## 2020-12-02 MED ORDER — METOCLOPRAMIDE HCL 5 MG PO TABS: 5.0000 mg | ORAL_TABLET | Freq: Three times a day (TID) | ORAL | Status: DC | PRN

## 2020-12-02 MED ORDER — DOCUSATE SODIUM 100 MG PO CAPS
100.0000 mg | ORAL_CAPSULE | Freq: Two times a day (BID) | ORAL | Status: DC
  Administered 2020-12-02 – 2020-12-04 (×4): 100 mg via ORAL
  Filled 2020-12-02 (×4): qty 1

## 2020-12-02 MED ORDER — MENTHOL 3 MG MT LOZG: 1.0000 | LOZENGE | OROMUCOSAL | Status: DC | PRN

## 2020-12-02 MED ORDER — SODIUM CHLORIDE 0.9 % IV SOLN
2.0000 g | Freq: Four times a day (QID) | INTRAVENOUS | Status: AC
  Administered 2020-12-02 (×2): 2 g via INTRAVENOUS
  Filled 2020-12-02 (×2): qty 2

## 2020-12-02 MED ORDER — SODIUM CHLORIDE 0.9 % IR SOLN
Status: DC | PRN
  Administered 2020-12-02: 1000 mL

## 2020-12-02 MED ORDER — METHOCARBAMOL 500 MG IVPB - SIMPLE MED
500.0000 mg | Freq: Four times a day (QID) | INTRAVENOUS | Status: DC | PRN
  Administered 2020-12-02: 500 mg via INTRAVENOUS
  Filled 2020-12-02 (×2): qty 50

## 2020-12-02 MED ORDER — TRANEXAMIC ACID-NACL 1000-0.7 MG/100ML-% IV SOLN
1000.0000 mg | INTRAVENOUS | Status: AC
  Administered 2020-12-02: 1000 mg via INTRAVENOUS
  Filled 2020-12-02: qty 100

## 2020-12-02 MED ORDER — OXYCODONE HCL 5 MG PO TABS
ORAL_TABLET | ORAL | Status: AC
  Filled 2020-12-02: qty 2

## 2020-12-02 MED ORDER — DIPHENHYDRAMINE HCL 12.5 MG/5ML PO ELIX: 12.5000 mg | ORAL_SOLUTION | ORAL | Status: DC | PRN

## 2020-12-02 MED ORDER — ACETAMINOPHEN 500 MG PO TABS
1000.0000 mg | ORAL_TABLET | Freq: Four times a day (QID) | ORAL | Status: AC
Start: 2020-12-02 — End: 2020-12-03
  Administered 2020-12-02 – 2020-12-03 (×4): 1000 mg via ORAL
  Filled 2020-12-02 (×4): qty 2

## 2020-12-02 MED ORDER — ARTIFICIAL TEARS OPHTHALMIC OINT
TOPICAL_OINTMENT | OPHTHALMIC | Status: AC
  Filled 2020-12-02: qty 3.5

## 2020-12-02 MED ORDER — METOCLOPRAMIDE HCL 5 MG/ML IJ SOLN
5.0000 mg | Freq: Three times a day (TID) | INTRAMUSCULAR | Status: DC | PRN
  Administered 2020-12-03 (×2): 10 mg via INTRAVENOUS
  Filled 2020-12-02 (×2): qty 2

## 2020-12-02 MED ORDER — MORPHINE SULFATE (PF) 4 MG/ML IV SOLN
0.5000 mg | INTRAVENOUS | Status: DC | PRN
Start: 2020-12-02 — End: 2020-12-02
  Administered 2020-12-02 (×2): 1 mg via INTRAVENOUS
  Filled 2020-12-02 (×2): qty 1

## 2020-12-02 MED ORDER — PHENYLEPHRINE HCL-NACL 10-0.9 MG/250ML-% IV SOLN
INTRAVENOUS | Status: DC | PRN
  Administered 2020-12-02: 30 ug/min via INTRAVENOUS

## 2020-12-02 MED ORDER — ACETAMINOPHEN 10 MG/ML IV SOLN
1000.0000 mg | Freq: Four times a day (QID) | INTRAVENOUS | Status: DC
  Administered 2020-12-02: 1000 mg via INTRAVENOUS
  Filled 2020-12-02: qty 100

## 2020-12-02 MED ORDER — SODIUM CHLORIDE 0.9 % IV SOLN: INTRAVENOUS | Status: DC

## 2020-12-02 SURGICAL SUPPLY — 52 items
ATTUNE MED DOME PAT 38 KNEE (Knees) ×2 IMPLANT
ATTUNE PS FEM LT SZ 6 CEM KNEE (Femur) ×2 IMPLANT
ATTUNE PSRP INSR SZ6 12 KNEE (Insert) ×2 IMPLANT
BAG COUNTER SPONGE SURGICOUNT (BAG) IMPLANT
BAG ZIPLOCK 12X15 (MISCELLANEOUS) ×2 IMPLANT
BASE TIBIA ATTUNE KNEE SYS SZ6 (Knees) ×1 IMPLANT
BLADE SAG 18X100X1.27 (BLADE) ×2 IMPLANT
BLADE SAW SGTL 11.0X1.19X90.0M (BLADE) ×2 IMPLANT
BNDG ELASTIC 6X5.8 VLCR STR LF (GAUZE/BANDAGES/DRESSINGS) ×2 IMPLANT
BOWL SMART MIX CTS (DISPOSABLE) ×2 IMPLANT
CEMENT HV SMART SET (Cement) ×4 IMPLANT
COVER SURGICAL LIGHT HANDLE (MISCELLANEOUS) ×2 IMPLANT
CUFF TOURN SGL QUICK 34 (TOURNIQUET CUFF) ×2
CUFF TRNQT CYL 34X4.125X (TOURNIQUET CUFF) ×1 IMPLANT
DECANTER SPIKE VIAL GLASS SM (MISCELLANEOUS) ×2 IMPLANT
DRAPE U-SHAPE 47X51 STRL (DRAPES) ×2 IMPLANT
DRSG AQUACEL AG ADV 3.5X10 (GAUZE/BANDAGES/DRESSINGS) ×2 IMPLANT
DURAPREP 26ML APPLICATOR (WOUND CARE) ×2 IMPLANT
ELECT REM PT RETURN 15FT ADLT (MISCELLANEOUS) ×2 IMPLANT
GLOVE SRG 8 PF TXTR STRL LF DI (GLOVE) ×1 IMPLANT
GLOVE SURG ENC MOIS LTX SZ6.5 (GLOVE) ×2 IMPLANT
GLOVE SURG ENC MOIS LTX SZ8 (GLOVE) ×4 IMPLANT
GLOVE SURG UNDER POLY LF SZ7 (GLOVE) ×2 IMPLANT
GLOVE SURG UNDER POLY LF SZ8 (GLOVE) ×2
GLOVE SURG UNDER POLY LF SZ8.5 (GLOVE) ×2 IMPLANT
GOWN STRL REUS W/TWL LRG LVL3 (GOWN DISPOSABLE) ×4 IMPLANT
GOWN STRL REUS W/TWL XL LVL3 (GOWN DISPOSABLE) ×2 IMPLANT
HANDPIECE INTERPULSE COAX TIP (DISPOSABLE) ×2
HOLDER FOLEY CATH W/STRAP (MISCELLANEOUS) IMPLANT
IMMOBILIZER KNEE 20 (SOFTGOODS) ×2
IMMOBILIZER KNEE 20 THIGH 36 (SOFTGOODS) ×1 IMPLANT
KIT TURNOVER KIT A (KITS) ×2 IMPLANT
MANIFOLD NEPTUNE II (INSTRUMENTS) ×2 IMPLANT
NS IRRIG 1000ML POUR BTL (IV SOLUTION) ×2 IMPLANT
PACK TOTAL KNEE CUSTOM (KITS) ×2 IMPLANT
PADDING CAST COTTON 6X4 STRL (CAST SUPPLIES) ×4 IMPLANT
PENCIL SMOKE EVACUATOR (MISCELLANEOUS) ×2 IMPLANT
PIN DRILL FIX HALF THREAD (BIT) ×2 IMPLANT
PIN STEINMAN FIXATION KNEE (PIN) ×2 IMPLANT
PROTECTOR NERVE ULNAR (MISCELLANEOUS) ×2 IMPLANT
SET HNDPC FAN SPRY TIP SCT (DISPOSABLE) ×1 IMPLANT
STRIP CLOSURE SKIN 1/2X4 (GAUZE/BANDAGES/DRESSINGS) ×4 IMPLANT
SUT MNCRL AB 4-0 PS2 18 (SUTURE) ×2 IMPLANT
SUT STRATAFIX 0 PDS 27 VIOLET (SUTURE) ×2
SUT VIC AB 2-0 CT1 27 (SUTURE) ×6
SUT VIC AB 2-0 CT1 TAPERPNT 27 (SUTURE) ×3 IMPLANT
SUTURE STRATFX 0 PDS 27 VIOLET (SUTURE) ×1 IMPLANT
TIBIA ATTUNE KNEE SYS BASE SZ6 (Knees) ×2 IMPLANT
TRAY FOLEY MTR SLVR 16FR STAT (SET/KITS/TRAYS/PACK) ×2 IMPLANT
TUBE SUCTION HIGH CAP CLEAR NV (SUCTIONS) ×2 IMPLANT
WATER STERILE IRR 1000ML POUR (IV SOLUTION) ×4 IMPLANT
WRAP KNEE MAXI GEL POST OP (GAUZE/BANDAGES/DRESSINGS) ×2 IMPLANT

## 2020-12-02 NOTE — Transfer of Care (Signed)
Immediate Anesthesia Transfer of Care Note  Patient: Deborah Hampton  Procedure(s) Performed: TOTAL KNEE ARTHROPLASTY (Left: Knee)  Patient Location: PACU  Anesthesia Type:Regional, Spinal and MAC combined with regional for post-op pain  Level of Consciousness: awake, alert , oriented and patient cooperative  Airway & Oxygen Therapy: Patient Spontanous Breathing  Post-op Assessment: Report given to RN and Post -op Vital signs reviewed and stable  Post vital signs: Reviewed and stable  Last Vitals:  Vitals Value Taken Time  BP 148/96 12/02/20 1134  Temp    Pulse 85 12/02/20 1136  Resp 18 12/02/20 1136  SpO2 99 % 12/02/20 1136  Vitals shown include unvalidated device data.  Last Pain:  Vitals:   12/02/20 0826  TempSrc:   PainSc: 0-No pain         Complications: No notable events documented.

## 2020-12-02 NOTE — Anesthesia Postprocedure Evaluation (Signed)
Anesthesia Post Note  Patient: Deborah Hampton  Procedure(s) Performed: TOTAL KNEE ARTHROPLASTY (Left: Knee)     Patient location during evaluation: PACU Anesthesia Type: Spinal Level of consciousness: awake and alert Pain management: pain level controlled Vital Signs Assessment: post-procedure vital signs reviewed and stable Respiratory status: spontaneous breathing and respiratory function stable Cardiovascular status: blood pressure returned to baseline and stable Postop Assessment: spinal receding Anesthetic complications: no   No notable events documented.  Last Vitals:  Vitals:   12/02/20 1315 12/02/20 1330  BP: 129/84 136/84  Pulse: 63 64  Resp: 13 15  Temp:  36.7 C  SpO2: 97% 99%    Last Pain:  Vitals:   12/02/20 1330  TempSrc:   PainSc: 5                  Kennieth Rad

## 2020-12-02 NOTE — Discharge Instructions (Addendum)
 Frank Aluisio, MD Total Joint Specialist [facility] [address] [place], [place] [address] [phone]  TOTAL KNEE REPLACEMENT POSTOPERATIVE DIRECTIONS    Knee Rehabilitation, Guidelines Following Surgery  Results after knee surgery are often greatly improved when you follow the exercise, range of motion and muscle strengthening exercises prescribed by your doctor. Safety measures are also important to protect the knee from further injury. If any of these exercises cause you to have increased pain or swelling in your knee joint, decrease the amount until you are comfortable again and slowly increase them. If you have problems or questions, call your caregiver or physical therapist for advice.   BLOOD CLOT PREVENTION Take a 10 mg Xarelto once a day for three weeks following surgery.  You may resume your vitamins/supplements once you have discontinued the Xarelto. Do not take any NSAIDs (Advil, Aleve, Ibuprofen, Meloxicam, etc.) until you have discontinued the Xarelto.   HOME CARE INSTRUCTIONS  Remove items at home which could result in a fall. This includes throw rugs or furniture in walking pathways.  ICE to the affected knee as much as tolerated. Icing helps control swelling. If the swelling is well controlled you will be more comfortable and rehab easier. Continue to use ice on the knee for pain and swelling from surgery. You may notice swelling that will progress down to the foot and ankle. This is normal after surgery. Elevate the leg when you are not up walking on it.    Continue to use the breathing machine which will help keep your temperature down. It is common for your temperature to cycle up and down following surgery, especially at night when you are not up moving around and exerting yourself. The breathing machine keeps your lungs expanded and your temperature down. Do not place pillow under the operative knee, focus on keeping the knee straight  while resting  DIET You may resume your previous home diet once you are discharged from the hospital.  DRESSING / WOUND CARE / SHOWERING Keep your bulky bandage on for 2 days. On the third post-operative day you may remove the Ace bandage and gauze. There is a waterproof adhesive bandage on your skin which will stay in place until your first follow-up appointment. Once you remove this you will not need to place another bandage You may begin showering 3 days following surgery, but do not submerge the incision under water.  ACTIVITY For the first 5 days, the key is rest and control of pain and swelling Do your home exercises twice a day starting on post-operative day 3. On the days you go to physical therapy, just do the home exercises once that day. You should rest, ice and elevate the leg for 50 minutes out of every hour. Get up and walk/stretch for 10 minutes per hour. After 5 days you can increase your activity slowly as tolerated. Walk with your walker as instructed. Use the walker until you are comfortable transitioning to a cane. Walk with the cane in the opposite hand of the operative leg. You may discontinue the cane once you are comfortable and walking steadily. Avoid periods of inactivity such as sitting longer than an hour when not asleep. This helps prevent blood clots.  You may discontinue the knee immobilizer once you are able to perform a straight leg raise while lying down. You may resume a sexual relationship in one month or when given the OK by your doctor.  You may return to work once   you are cleared by your doctor.  Do not drive a car for 6 weeks or until released by your surgeon.  Do not drive while taking narcotics.  TED HOSE STOCKINGS Wear the elastic stockings on both legs for three weeks following surgery during the day. You may remove them at night for sleeping.  WEIGHT BEARING Weight bearing as tolerated with assist device (walker, cane, etc) as directed, use it as  long as suggested by your surgeon or therapist, typically at least 4-6 weeks.  POSTOPERATIVE CONSTIPATION PROTOCOL Constipation - defined medically as fewer than three stools per week and severe constipation as less than one stool per week.  One of the most common issues patients have following surgery is constipation.  Even if you have a regular bowel pattern at home, your normal regimen is likely to be disrupted due to multiple reasons following surgery.  Combination of anesthesia, postoperative narcotics, change in appetite and fluid intake all can affect your bowels.  In order to avoid complications following surgery, here are some recommendations in order to help you during your recovery period.  Colace (docusate) - Pick up an over-the-counter form of Colace or another stool softener and take twice a day as long as you are requiring postoperative pain medications.  Take with a full glass of water daily.  If you experience loose stools or diarrhea, hold the colace until you stool forms back up. If your symptoms do not get better within 1 week or if they get worse, check with your doctor. Dulcolax (bisacodyl) - Pick up over-the-counter and take as directed by the product packaging as needed to assist with the movement of your bowels.  Take with a full glass of water.  Use this product as needed if not relieved by Colace only.  MiraLax (polyethylene glycol) - Pick up over-the-counter to have on hand. MiraLax is a solution that will increase the amount of water in your bowels to assist with bowel movements.  Take as directed and can mix with a glass of water, juice, soda, coffee, or tea. Take if you go more than two days without a movement. Do not use MiraLax more than once per day. Call your doctor if you are still constipated or irregular after using this medication for 7 days in a row.  If you continue to have problems with postoperative constipation, please contact the office for further assistance  and recommendations.  If you experience "the worst abdominal pain ever" or develop nausea or vomiting, please contact the office immediatly for further recommendations for treatment.  ITCHING If you experience itching with your medications, try taking only a single pain pill, or even half a pain pill at a time.  You can also use Benadryl over the counter for itching or also to help with sleep.   MEDICATIONS See your medication summary on the "After Visit Summary" that the nursing staff will review with you prior to discharge.  You may have some home medications which will be placed on hold until you complete the course of blood thinner medication.  It is important for you to complete the blood thinner medication as prescribed by your surgeon.  Continue your approved medications as instructed at time of discharge.  PRECAUTIONS If you experience chest pain or shortness of breath - call 911 immediately for transfer to the hospital emergency department.  If you develop a fever greater that 101 F, purulent drainage from wound, increased redness or drainage from wound, foul odor from the wound/dressing,   or calf pain - CONTACT YOUR SURGEON.                                                   FOLLOW-UP APPOINTMENTS Make sure you keep all of your appointments after your operation with your surgeon and caregivers. You should call the office at the above phone number and make an appointment for approximately two weeks after the date of your surgery or on the date instructed by your surgeon outlined in the "After Visit Summary".  RANGE OF MOTION AND STRENGTHENING EXERCISES  Rehabilitation of the knee is important following a knee injury or an operation. After just a few days of immobilization, the muscles of the thigh which control the knee become weakened and shrink (atrophy). Knee exercises are designed to build up the tone and strength of the thigh muscles and to improve knee motion. Often times heat used for  twenty to thirty minutes before working out will loosen up your tissues and help with improving the range of motion but do not use heat for the first two weeks following surgery. These exercises can be done on a training (exercise) mat, on the floor, on a table or on a bed. Use what ever works the best and is most comfortable for you Knee exercises include:  Leg Lifts - While your knee is still immobilized in a splint or cast, you can do straight leg raises. Lift the leg to 60 degrees, hold for 3 sec, and slowly lower the leg. Repeat 10-20 times 2-3 times daily. Perform this exercise against resistance later as your knee gets better.  Quad and Hamstring Sets - Tighten up the muscle on the front of the thigh (Quad) and hold for 5-10 sec. Repeat this 10-20 times hourly. Hamstring sets are done by pushing the foot backward against an object and holding for 5-10 sec. Repeat as with quad sets.  Leg Slides: Lying on your back, slowly slide your foot toward your buttocks, bending your knee up off the floor (only go as far as is comfortable). Then slowly slide your foot back down until your leg is flat on the floor again. Angel Wings: Lying on your back spread your legs to the side as far apart as you can without causing discomfort.  A rehabilitation program following serious knee injuries can speed recovery and prevent re-injury in the future due to weakened muscles. Contact your doctor or a physical therapist for more information on knee rehabilitation.   POST-OPERATIVE OPIOID TAPER INSTRUCTIONS: It is important to wean off of your opioid medication as soon as possible. If you do not need pain medication after your surgery it is ok to stop day one. Opioids include: Codeine, Hydrocodone(Norco, Vicodin), Oxycodone(Percocet, oxycontin) and hydromorphone amongst others.  Long term and even short term use of opiods can cause: Increased pain response Dependence Constipation Depression Respiratory depression And  more.  Withdrawal symptoms can include Flu like symptoms Nausea, vomiting And more Techniques to manage these symptoms Hydrate well Eat regular healthy meals Stay active Use relaxation techniques(deep breathing, meditating, yoga) Do Not substitute Alcohol to help with tapering If you have been on opioids for less than two weeks and do not have pain than it is ok to stop all together.  Plan to wean off of opioids This plan should start within one week post op of   your joint replacement. Maintain the same interval or time between taking each dose and first decrease the dose.  Cut the total daily intake of opioids by one tablet each day Next start to increase the time between doses. The last dose that should be eliminated is the evening dose.   IF YOU ARE TRANSFERRED TO A SKILLED REHAB FACILITY If the patient is transferred to a skilled rehab facility following release from the hospital, a list of the current medications will be sent to the facility for the patient to continue.  When discharged from the skilled rehab facility, please have the facility set up the patient's Home Health Physical Therapy prior to being released. Also, the skilled facility will be responsible for providing the patient with their medications at time of release from the facility to include their pain medication, the muscle relaxants, and their blood thinner medication. If the patient is still at the rehab facility at time of the two week follow up appointment, the skilled rehab facility will also need to assist the patient in arranging follow up appointment in our office and any transportation needs.  MAKE SURE YOU:  Understand these instructions.  Get help right away if you are not doing well or get worse.   DENTAL ANTIBIOTICS:  In most cases prophylactic antibiotics for Dental procdeures after total joint surgery are not necessary.  Exceptions are as follows:  1. History of prior total joint infection  2.  Severely immunocompromised (Organ Transplant, cancer chemotherapy, Rheumatoid biologic meds such as Humera)  3. Poorly controlled diabetes (A1C &gt; 8.0, blood glucose over 200)  If you have one of these conditions, contact your surgeon for an antibiotic prescription, prior to your dental procedure.    Pick up stool softner and laxative for home use following surgery while on pain medications. Do not submerge incision under water. Please use good hand washing techniques while changing dressing each day. May shower starting three days after surgery. Please use a clean towel to pat the incision dry following showers. Continue to use ice for pain and swelling after surgery. Do not use any lotions or creams on the incision until instructed by your surgeon.  Information on my medicine - XARELTO (Rivaroxaban)  This medication education was reviewed with me or my healthcare representative as part of my discharge preparation.  The pharmacist that spoke with me during my hospital stay was:    Why was Xarelto prescribed for you? Xarelto was prescribed for you to reduce the risk of blood clots forming after orthopedic surgery. The medical term for these abnormal blood clots is venous thromboembolism (VTE).  What do you need to know about xarelto ? Take your Xarelto ONCE DAILY at the same time every day. You may take it either with or without food.  If you have difficulty swallowing the tablet whole, you may crush it and mix in applesauce just prior to taking your dose.  Take Xarelto exactly as prescribed by your doctor and DO NOT stop taking Xarelto without talking to the doctor who prescribed the medication.  Stopping without other VTE prevention medication to take the place of Xarelto may increase your risk of developing a clot.  After discharge, you should have regular check-up appointments with your healthcare provider that is prescribing your Xarelto.    What do you do if you  miss a dose? If you miss a dose, take it as soon as you remember on the same day then continue your regularly scheduled once daily   regimen the next day. Do not take two doses of Xarelto on the same day.   Important Safety Information A possible side effect of Xarelto is bleeding. You should call your healthcare provider right away if you experience any of the following: Bleeding from an injury or your nose that does not stop. Unusual colored urine (red or dark brown) or unusual colored stools (red or black). Unusual bruising for unknown reasons. A serious fall or if you hit your head (even if there is no bleeding).  Some medicines may interact with Xarelto and might increase your risk of bleeding while on Xarelto. To help avoid this, consult your healthcare provider or pharmacist prior to using any new prescription or non-prescription medications, including herbals, vitamins, non-steroidal anti-inflammatory drugs (NSAIDs) and supplements.  This website has more information on Xarelto: www.xarelto.com.    

## 2020-12-02 NOTE — Progress Notes (Signed)
PT Cancellation Note  Patient Details Name: Deborah Hampton MRN: [id number] DOB: [birth year]   Cancelled Treatment:    Reason Eval/Treat Not Completed: Pain limiting ability to participate (per RN pt with 10/10 pain despite medication and non-pharmacologic interventions. will follow up at later date/time as pt able and schedule allows.)   Wynn Maudlin, DPT Acute Rehabilitation Services Office [phone] Pager [phone]

## 2020-12-02 NOTE — Anesthesia Procedure Notes (Addendum)
Spinal  Patient location during procedure: OR Start time: 12/02/2020 9:45 AM End time: 12/02/2020 9:50 AM Reason for block: surgical anesthesia Staffing Performed: anesthesiologist  Anesthesiologist: Marcene Duos, MD Preanesthetic Checklist Completed: patient identified, IV checked, site marked, risks and benefits discussed, surgical consent, monitors and equipment checked, pre-op evaluation and timeout performed Spinal Block Patient position: sitting Prep: DuraPrep Patient monitoring: heart rate, cardiac monitor, continuous pulse ox and blood pressure Approach: midline Location: L4-5 Injection technique: single-shot Needle Needle type: Pencan  Needle gauge: 24 G Needle length: 9 cm Assessment Sensory level: T4 Events: CSF return and second provider Additional Notes Unsuccessful SAB by CRNA x 2 attempts. X1 attempt by myself at one level lower L4/5. Free flowing csf. Aspiration before and after injection.

## 2020-12-02 NOTE — Anesthesia Preprocedure Evaluation (Signed)
Anesthesia Evaluation  Patient identified by MRN, date of birth, ID band Patient awake    Reviewed: Allergy & Precautions, NPO status , Patient's Chart, lab work & pertinent test results  Airway Mallampati: II  TM Distance: >3 FB     Dental  (+) Dental Advisory Given   Pulmonary neg pulmonary ROS,    breath sounds clear to auscultation       Cardiovascular hypertension, Pt. on medications  Rhythm:Regular Rate:Normal     Neuro/Psych negative neurological ROS     GI/Hepatic negative GI ROS, Neg liver ROS,   Endo/Other  negative endocrine ROS  Renal/GU negative Renal ROS     Musculoskeletal  (+) Arthritis ,   Abdominal   Peds  Hematology negative hematology ROS (+)   Anesthesia Other Findings   Reproductive/Obstetrics                             Anesthesia Physical Anesthesia Plan  ASA: 2  Anesthesia Plan: Spinal   Post-op Pain Management:  Regional for Post-op pain   Induction:   PONV Risk Score and Plan: 2 and Propofol infusion, Ondansetron and Treatment may vary due to age or medical condition  Airway Management Planned: Natural Airway and Simple Face Mask  Additional Equipment:   Intra-op Plan:   Post-operative Plan:   Informed Consent: I have reviewed the patients History and Physical, chart, labs and discussed the procedure including the risks, benefits and alternatives for the proposed anesthesia with the patient or authorized representative who has indicated his/her understanding and acceptance.       Plan Discussed with: CRNA  Anesthesia Plan Comments:         Anesthesia Quick Evaluation

## 2020-12-02 NOTE — Progress Notes (Signed)
Assisted Dr. Rob Fitzgerald with left, ultrasound guided, adductor canal block. Side rails up, monitors on throughout procedure. See vital signs in flow sheet. Tolerated Procedure well.  

## 2020-12-02 NOTE — Interval H&P Note (Signed)
History and Physical Interval Note:  12/02/2020 7:17 AM  Deborah Hampton  has presented today for surgery, with the diagnosis of left knee osteoarthritis.  The various methods of treatment have been discussed with the patient and family. After consideration of risks, benefits and other options for treatment, the patient has consented to  Procedure(s): TOTAL KNEE ARTHROPLASTY (Left) as a surgical intervention.  The patient's history has been reviewed, patient examined, no change in status, stable for surgery.  I have reviewed the patient's chart and labs.  Questions were answered to the patient's satisfaction.     Homero Fellers Ieisha Gao

## 2020-12-02 NOTE — Op Note (Signed)
OPERATIVE REPORT-TOTAL KNEE ARTHROPLASTY   Pre-operative diagnosis- Osteoarthritis  Left knee(s)  Post-operative diagnosis- Osteoarthritis Left knee(s)  Procedure-  Left  Total Knee Arthroplasty  Surgeon- Gus Rankin. [place] Mcphee, MD  Assistant- Nelia Shi, PA-C   Anesthesia-   Adductor canal block and spinal  EBL-20 mL   Drains None  Tourniquet time-  Total Tourniquet Time Documented: Thigh (Left) - 36 minutes Total: Thigh (Left) - 36 minutes     Complications- None  Condition-PACU - hemodynamically stable.   Brief Clinical Note  Deborah Hampton is a 65 y.o. year old female with end stage OA of her left knee with progressively worsening pain and dysfunction. She has constant pain, with activity and at rest and significant functional deficits with difficulties even with ADLs. She has had extensive non-op management including analgesics, injections of cortisone and viscosupplements, and home exercise program, but remains in significant pain with significant dysfunction. Radiographs show bone on bone arthritis medial and patellofemoral . She presents now for left Total Knee Arthroplasty.     Procedure in detail---   The patient is brought into the operating room and positioned supine on the operating table. After successful administration of  Adductor canal block and spinal,   a tourniquet is placed high on the  Left thigh(s) and the lower extremity is prepped and draped in the usual sterile fashion. Time out is performed by the operating team and then the  Left lower extremity is wrapped in Esmarch, knee flexed and the tourniquet inflated to 300 mmHg.       A midline incision is made with a ten blade through the subcutaneous tissue to the level of the extensor mechanism. A fresh blade is used to make a medial parapatellar arthrotomy. Soft tissue over the proximal medial tibia is subperiosteally elevated to the joint line with a knife and into the semimembranosus bursa with a Cobb  elevator. Soft tissue over the proximal lateral tibia is elevated with attention being paid to avoiding the patellar tendon on the tibial tubercle. The patella is everted, knee flexed 90 degrees and the ACL and PCL are removed. Findings are bone on bone medial and patellofemoral with large global osteophytes.        The drill is used to create a starting hole in the distal femur and the canal is thoroughly irrigated with sterile saline to remove the fatty contents. The 5 degree Left  valgus alignment guide is placed into the femoral canal and the distal femoral cutting block is pinned to remove 9 mm off the distal femur. Resection is made with an oscillating saw.      The tibia is subluxed forward and the menisci are removed. The extramedullary alignment guide is placed referencing proximally at the medial aspect of the tibial tubercle and distally along the second metatarsal axis and tibial crest. The block is pinned to remove 55mm off the more deficient medial  side. Resection is made with an oscillating saw. Size 6is the most appropriate size for the tibia and the proximal tibia is prepared with the modular drill and keel punch for that size.      The femoral sizing guide is placed and size 6 is most appropriate. Rotation is marked off the epicondylar axis and confirmed by creating a rectangular flexion gap at 90 degrees. The size 6 cutting block is pinned in this rotation and the anterior, posterior and chamfer cuts are made with the oscillating saw. The intercondylar block is then placed and that cut  is made.      Trial size 6 tibial component, trial size 6 posterior stabilized femur and a 12  mm posterior stabilized rotating platform insert trial is placed. Full extension is achieved with excellent varus/valgus and anterior/posterior balance throughout full range of motion. The patella is everted and thickness measured to be 25  mm. Free hand resection is taken to 15 mm, a 38 template is placed, lug holes  are drilled, trial patella is placed, and it tracks normally. Osteophytes are removed off the posterior femur with the trial in place. All trials are removed and the cut bone surfaces prepared with pulsatile lavage. Cement is mixed and once ready for implantation, the size 6 tibial implant, size  6 posterior stabilized femoral component, and the size 38 patella are cemented in place and the patella is held with the clamp. The trial insert is placed and the knee held in full extension. The Exparel (20 ml mixed with 60 ml saline) is injected into the extensor mechanism, posterior capsule, medial and lateral gutters and subcutaneous tissues.  All extruded cement is removed and once the cement is hard the permanent 12 mm posterior stabilized rotating platform insert is placed into the tibial tray.      The wound is copiously irrigated with saline solution and the extensor mechanism closed with # 0 Stratofix suture. The tourniquet is released for a total tourniquet time of 36  minutes. Flexion against gravity is 140 degrees and the patella tracks normally. Subcutaneous tissue is closed with 2.0 vicryl and subcuticular with running 4.0 Monocryl. The incision is cleaned and dried and steri-strips and a bulky sterile dressing are applied. The limb is placed into a knee immobilizer and the patient is awakened and transported to recovery in stable condition.      Please note that a surgical assistant was a medical necessity for this procedure in order to perform it in a safe and expeditious manner. Surgical assistant was necessary to retract the ligaments and vital neurovascular structures to prevent injury to them and also necessary for proper positioning of the limb to allow for anatomic placement of the prosthesis.   Gus Rankin Blakeleigh Domek, MD    12/02/2020, 10:55 AM

## 2020-12-02 NOTE — Anesthesia Procedure Notes (Signed)
Anesthesia Regional Block: Adductor canal block   Pre-Anesthetic Checklist: , timeout performed,  Correct Patient, Correct Site, Correct Laterality,  Correct Procedure, Correct Position, site marked,  Risks and benefits discussed,  Surgical consent,  Pre-op evaluation,  At surgeon's request and post-op pain management  Laterality: Left  Prep: chloraprep       Needles:  Injection technique: Single-shot  Needle Type: Echogenic Needle     Needle Length: 9cm  Needle Gauge: 21     Additional Needles:   Procedures:,,,, ultrasound used (permanent image in chart),,    Narrative:  Start time: 12/02/2020 8:22 AM End time: 12/02/2020 8:27 AM Injection made incrementally with aspirations every 5 mL.  Performed by: Personally  Anesthesiologist: Marcene Duos, MD

## 2020-12-03 ENCOUNTER — Encounter (HOSPITAL_COMMUNITY): Payer: Self-pay | Admitting: Orthopedic Surgery

## 2020-12-03 ENCOUNTER — Other Ambulatory Visit: Payer: Self-pay

## 2020-12-03 DIAGNOSIS — M1712 Unilateral primary osteoarthritis, left knee: Secondary | ICD-10-CM | POA: Diagnosis not present

## 2020-12-03 LAB — BASIC METABOLIC PANEL
Anion gap: 6 (ref 5–15)
BUN: 13 mg/dL (ref 8–23)
CO2: 25 mmol/L (ref 22–32)
Calcium: 8.9 mg/dL (ref 8.9–10.3)
Chloride: 103 mmol/L (ref 98–111)
Creatinine, Ser: 0.61 mg/dL (ref 0.44–1.00)
GFR, Estimated: >60 mL/min (ref >60)
Glucose, Bld: 153 mg/dL — ABNORMAL HIGH (ref 70–99)
Potassium: 4.8 mmol/L (ref 3.5–5.1)
Sodium: 134 mmol/L — ABNORMAL LOW (ref 135–145)

## 2020-12-03 LAB — CBC
HCT: 37.6 % (ref 36.0–46.0)
Hemoglobin: 12.7 g/dL (ref 12.0–15.0)
MCH: 31.6 pg (ref 26.0–34.0)
MCHC: 33.8 g/dL (ref 30.0–36.0)
MCV: 93.5 fL (ref 80.0–100.0)
Platelets: 259 10*3/uL (ref 150–400)
RBC: 4.02 MIL/uL (ref 3.87–5.11)
RDW: 12.6 % (ref 11.5–15.5)
WBC: 10.4 10*3/uL (ref 4.0–10.5)
nRBC: 0 % (ref 0.0–0.2)

## 2020-12-03 MED ORDER — GABAPENTIN 300 MG PO CAPS: ORAL_CAPSULE | ORAL | 0 refills | Status: DC

## 2020-12-03 MED ORDER — RIVAROXABAN 10 MG PO TABS: 10.0000 mg | ORAL_TABLET | Freq: Every day | ORAL | 0 refills | Status: DC

## 2020-12-03 MED ORDER — CYCLOBENZAPRINE HCL 10 MG PO TABS
10.0000 mg | ORAL_TABLET | Freq: Three times a day (TID) | ORAL | Status: DC | PRN
  Administered 2020-12-03 – 2020-12-04 (×2): 10 mg via ORAL
  Filled 2020-12-03 (×3): qty 1

## 2020-12-03 MED ORDER — TRAMADOL HCL 50 MG PO TABS: 50.0000 mg | ORAL_TABLET | Freq: Four times a day (QID) | ORAL | 0 refills | Status: DC | PRN

## 2020-12-03 MED ORDER — HYDROMORPHONE HCL 2 MG PO TABS: 2.0000 mg | ORAL_TABLET | Freq: Four times a day (QID) | ORAL | 0 refills | Status: DC | PRN

## 2020-12-03 MED ORDER — CYCLOBENZAPRINE HCL 10 MG PO TABS: 10.0000 mg | ORAL_TABLET | Freq: Three times a day (TID) | ORAL | 0 refills | Status: DC | PRN

## 2020-12-03 NOTE — TOC Transition Note (Signed)
Transition of Care [facility]) - CM/SW Discharge Note  Patient Details  Name: Deborah Hampton MRN: [id number] Date of Birth: 1956-02-25  Transition of Care [facility]) CM/SW Contact:  Sherie Don, LCSW Phone Number: 12/03/2020, 10:42 AM  Clinical Narrative: Patient will discharge after being cleared by ortho and PT. CSW met with patient to review discharge plan and needs. Patient will discharge home with OPPT through [facility] PT in [facility]. Patient has a rolling walker and high toilets at home, so patient does not think she will need a 3N1 at this time. Patient's bathroom is also a few steps from her bed. TOC signing off.  Final next level of care: OP Rehab Barriers to Discharge: No Barriers Identified  Patient Goals and CMS Choice Patient states their goals for this hospitalization and ongoing recovery are:: Discharge home with [place] CMS Medicare.gov Compare Post Acute Care list provided to:: Patient Choice offered to / list presented to : Patient  Discharge Plan and Services        DME Arranged: N/A DME Agency: NA  Readmission Risk Interventions No flowsheet data found.

## 2020-12-03 NOTE — Evaluation (Signed)
Physical Therapy Evaluation Patient Details Name: Deborah Hampton MRN: [id number] DOB: December 19, 1955 Today's Date: 12/03/2020   History of Present Illness  Pt s/p L TKR and with hx of R UKR  Clinical Impression  Pt s/p L TKR and presents with decreased L LE strength/ROM and post op pain limiting functional mobility.  Pt should progress to dc home with family assist and has first OP PT scheduled for 12/07/20    Follow Up Recommendations Follow surgeon's recommendation for DC plan and follow-up therapies    Equipment Recommendations  None recommended by PT    Recommendations for Other Services       Precautions / Restrictions Precautions Precautions: Fall Restrictions Weight Bearing Restrictions: No Other Position/Activity Restrictions: WBAT      Mobility  Bed Mobility Overal bed mobility: Needs Assistance Bed Mobility: Supine to Sit     Supine to sit: Min assist     General bed mobility comments: cues for sequence and use of R LE to self assist    Transfers Overall transfer level: Needs assistance Equipment used: Rolling walker (2 wheeled) Transfers: Sit to/from Stand Sit to Stand: Min assist         General transfer comment: cues for LE management and use of UE to self assist  Ambulation/Gait Ambulation/Gait assistance: Min assist Gait Distance (Feet): 48 Feet Assistive device: Rolling walker (2 wheeled) Gait Pattern/deviations: Step-to pattern;Decreased step length - right;Decreased step length - left;Shuffle;Trunk flexed Gait velocity: decr   General Gait Details: cues for sequence, posture and position from [employer]            Wheelchair Mobility    Modified Rankin (Stroke Patients Only)       Balance Overall balance assessment: Needs assistance Sitting-balance support: No upper extremity supported;Feet supported Sitting balance-Leahy Scale: Good     Standing balance support: Bilateral upper extremity supported Standing  balance-Leahy Scale: Poor                               Pertinent Vitals/Pain Pain Assessment: 0-10 Pain Score: 5  Pain Location: L knee Pain Descriptors / Indicators: Aching;Sore;Grimacing;Guarding Pain Intervention(s): Limited activity within patient's tolerance;Monitored during session;Premedicated before session;Ice applied    Home Living Family/patient expects to be discharged to:: Private residence Living Arrangements: Spouse/significant other Available Help at Discharge: Family Type of Home: House Home Access: Stairs to enter Entrance Stairs-Rails: Right Entrance Stairs-Number of Steps: 4 Home Layout: Able to live on main level with bedroom/bathroom Home Equipment: Walker - 2 wheels;Cane - single point      Prior Function Level of Independence: Independent               Hand Dominance        Extremity/Trunk Assessment   Upper Extremity Assessment Upper Extremity Assessment: Overall WFL for tasks assessed    Lower Extremity Assessment Lower Extremity Assessment: LLE deficits/detail LLE Deficits / Details: 3-/5 quads LLE: Unable to fully assess due to pain    Cervical / Trunk Assessment Cervical / Trunk Assessment: Normal  Communication   Communication: No difficulties  Cognition Arousal/Alertness: Awake/alert Behavior During Therapy: WFL for tasks assessed/performed Overall Cognitive Status: Within Functional Limits for tasks assessed                                        General Comments  Exercises Total Joint Exercises Ankle Circles/Pumps: AROM;Both;15 reps;Supine Quad Sets: AROM;Both;5 reps;Supine Straight Leg Raises: AAROM;Left;5 reps;Supine   Assessment/Plan    PT Assessment Patient needs continued PT services  PT Problem List Decreased strength;Decreased range of motion;Decreased activity tolerance;Decreased balance;Decreased mobility;Decreased knowledge of use of DME;Pain       PT Treatment  Interventions DME instruction;Stair training;Gait training;Functional mobility training;Therapeutic activities;Therapeutic exercise;Patient/family education    PT Goals (Current goals can be found in the Care Plan section)  Acute Rehab PT Goals Patient Stated Goal: Less pain PT Goal Formulation: With patient Time For Goal Achievement: 12/03/20 Potential to Achieve Goals: Good    Frequency 7X/week   Barriers to discharge        Co-evaluation               AM-PAC PT "6 Clicks" Mobility  Outcome Measure Help needed turning from your back to your side while in a flat bed without using bedrails?: A Little Help needed moving from lying on your back to sitting on the side of a flat bed without using bedrails?: A Little Help needed moving to and from a bed to a chair (including a wheelchair)?: A Little Help needed standing up from a chair using your arms (e.g., wheelchair or bedside chair)?: A Little Help needed to walk in hospital room?: A Little Help needed climbing 3-5 steps with a railing? : A Little 6 Click Score: 18    End of Session Equipment Utilized During Treatment: Gait belt;Left knee immobilizer Activity Tolerance: Patient limited by pain;Patient tolerated treatment well Patient left: in chair;with call bell/phone within reach;with chair alarm set;with family/visitor present Nurse Communication: Mobility status PT Visit Diagnosis: Difficulty in walking, not elsewhere classified (R26.2);Pain Pain - Right/Left: Left Pain - part of body: Knee    Time: 2841-3244 PT Time Calculation (min) (ACUTE ONLY): 37 min   Charges:   PT Evaluation $PT Eval Low Complexity: 1 Low PT Treatments $Gait Training: 8-22 mins        Mauro Kaufmann PT Acute Rehabilitation Services Pager [phone] Office [phone]   Lutisha Knoche 12/03/2020, 12:13 PM

## 2020-12-03 NOTE — Progress Notes (Signed)
   Subjective: 1 Day Post-Op Procedure(s) (LRB): TOTAL KNEE ARTHROPLASTY (Left) Patient reports pain as moderate.   Patient seen in rounds by Dr. Lequita Halt. Patient had issues with pain control beginning yesterday. PO and IV narcotics were switched to hydromorphone with some relief. Today she is having issues with muscle spasms despite use of methocarbamol. Will switch this to flexeril. Foley catheter to be removed this AM. Denies chest pain or SOB. We will begin therapy today.   Objective: Vital signs in last 24 hours: Temp:  [97.8 F (36.6 C)-98.6 F (37 C)] 98.6 F (37 C) (07/28 0543) Pulse Rate:  [63-97] 87 (07/28 0543) Resp:  [11-18] 15 (07/28 0543) BP: (127-162)/(74-101) 139/74 (07/28 0543) SpO2:  [95 %-100 %] 100 % (07/28 0543)  Intake/Output from previous day:  Intake/Output Summary (Last 24 hours) at 12/03/2020 0726 Last data filed at 12/03/2020 0545 Gross per 24 hour  Intake 3202.5 ml  Output 2470 ml  Net 732.5 ml     Intake/Output this shift: No intake/output data recorded.  Labs: Recent Labs    12/03/20 0345  HGB 12.7   Recent Labs    12/03/20 0345  WBC 10.4  RBC 4.02  HCT 37.6  PLT 259   Recent Labs    12/03/20 0345  NA 134*  K 4.8  CL 103  CO2 25  BUN 13  CREATININE 0.61  GLUCOSE 153*  CALCIUM 8.9   No results for input(s): LABPT, INR in the last 72 hours.  Exam: General - Patient is Alert and Oriented Extremity - Neurologically intact Neurovascular intact Sensation intact distally Dorsiflexion/Plantar flexion intact Dressing - dressing C/D/I Motor Function - intact, moving foot and toes well on exam.   Past Medical History:  Diagnosis Date   Arthritis    Hypertension    Osteopenia 12/2015   T score -1.3    Assessment/Plan: 1 Day Post-Op Procedure(s) (LRB): TOTAL KNEE ARTHROPLASTY (Left) Principal Problem:   OA (osteoarthritis) of knee Active Problems:   Primary osteoarthritis of left knee  Estimated body mass index is  28.89 kg/m as calculated from the following:   Height as of this encounter: 5\' 8"  (1.727 m).   Weight as of this encounter: 86.2 kg. Advance diet Up with therapy   Patient's anticipated LOS is less than 2 midnights, meeting these requirements: - Younger than 76 - Lives within 1 hour of care - Has a competent adult at home to recover with post-op recover - NO history of  - Chronic pain requiring opiods  - Diabetes  - Coronary Artery Disease  - Heart failure  - Heart attack  - Stroke  - DVT/VTE  - Cardiac arrhythmia  - Respiratory Failure/COPD  - Renal failure  - Anemia  - Advanced Liver disease   DVT Prophylaxis - Xarelto Weight bearing as tolerated. Begin therapy.  Plan is to go Home after hospital stay. Possible discharge this afternoon if pain controlled and meeting goals with therapy. Otherwise, will stay until tomorrow. Scheduled [facility] in [facility] Follow-up in the office in 2 weeks  The PDMP database was reviewed today prior to any opioid medications being prescribed to this patient.  [facility], PA-C Orthopedic Surgery [phone] 12/03/2020, 7:26 AM

## 2020-12-03 NOTE — Progress Notes (Signed)
Physical Therapy Treatment Patient Details Name: Deborah Hampton MRN: [id number] DOB: [birth year] Today's Date: 12/03/2020    History of Present Illness Pt s/p L TKR and with hx of R UKR    PT Comments    Pt very cooperative and with pain at 5/10 but limited this pm by onset of N/V - RN aware.   Follow Up Recommendations  Follow surgeon's recommendation for DC plan and follow-up therapies     Equipment Recommendations  None recommended by PT    Recommendations for Other Services       Precautions / Restrictions Precautions Precautions: Fall Restrictions Weight Bearing Restrictions: No Other Position/Activity Restrictions: WBAT    Mobility  Bed Mobility Overal bed mobility: Needs Assistance Bed Mobility: Sit to Supine;Supine to Sit     Supine to sit: Min guard Sit to supine: Min guard   General bed mobility comments: cues for sequence and use of R LE to self assist    Transfers Overall transfer level: Needs assistance Equipment used: Rolling walker (2 wheeled) Transfers: Sit to/from Stand Sit to Stand: Min guard         General transfer comment: steady assist with cues for LE management and use of UEs to self assist  Ambulation/Gait Ambulation/Gait assistance: Min assist Gait Distance (Feet): 28 Feet Assistive device: Rolling walker (2 wheeled) Gait Pattern/deviations: Step-to pattern;Shuffle;Trunk flexed;Decreased step length - right;Decreased step length - left Gait velocity: decr   General Gait Details: cues for sequence, posture and position from [employer]             Wheelchair Mobility    Modified Rankin (Stroke Patients Only)       Balance Overall balance assessment: Needs assistance Sitting-balance support: No upper extremity supported;Feet supported Sitting balance-Leahy Scale: Good     Standing balance support: Bilateral upper extremity supported Standing balance-Leahy Scale: Fair                               Cognition Arousal/Alertness: Awake/alert Behavior During Therapy: WFL for tasks assessed/performed Overall Cognitive Status: Within Functional Limits for tasks assessed                                        Exercises Total Joint Exercises Ankle Circles/Pumps: AROM;Both;15 reps;Supine Quad Sets: AROM;Both;5 reps;Supine Straight Leg Raises: AAROM;Left;5 reps;Supine    General Comments        Pertinent Vitals/Pain Pain Assessment: 0-10 Pain Score: 5  Pain Location: L knee Pain Descriptors / Indicators: Aching;Sore;Grimacing;Guarding Pain Intervention(s): Limited activity within patient's tolerance;Monitored during session;Premedicated before session;Ice applied    Home Living Family/patient expects to be discharged to:: Private residence Living Arrangements: Spouse/significant other Available Help at Discharge: Family Type of Home: House Home Access: Stairs to enter Entrance Stairs-Rails: Right Home Layout: Able to live on main level with bedroom/bathroom Home Equipment: Walker - 2 wheels;Cane - single point      Prior Function Level of Independence: Independent          PT Goals (current goals can now be found in the care plan section) Acute Rehab PT Goals Patient Stated Goal: Less pain PT Goal Formulation: With patient Time For Goal Achievement: 12/03/20 Potential to Achieve Goals: Good Progress towards PT goals: Not progressing toward goals - comment (N/V)    Frequency    7X/week  PT Plan Current plan remains appropriate    Co-evaluation              AM-PAC PT "6 Clicks" Mobility   Outcome Measure  Help needed turning from your back to your side while in a flat bed without using bedrails?: A Little Help needed moving from lying on your back to sitting on the side of a flat bed without using bedrails?: A Little Help needed moving to and from a bed to a chair (including a wheelchair)?: A Little Help needed standing up  from a chair using your arms (e.g., wheelchair or bedside chair)?: A Little Help needed to walk in hospital room?: A Little Help needed climbing 3-5 steps with a railing? : A Little 6 Click Score: 18    End of Session Equipment Utilized During Treatment: Gait belt;Left knee immobilizer Activity Tolerance: Patient limited by pain;Other (comment) (nausea and vomiting) Patient left: in bed;with call bell/phone within reach;with family/visitor present Nurse Communication: Mobility status PT Visit Diagnosis: Difficulty in walking, not elsewhere classified (R26.2);Pain Pain - Right/Left: Left Pain - part of body: Knee     Time: 1583-0940 PT Time Calculation (min) (ACUTE ONLY): 22 min  Charges:  $Gait Training: 8-22 mins                     Mauro Kaufmann PT Acute Rehabilitation Services Pager [phone] Office [phone]    Deborah Hampton 12/03/2020, 2:51 PM

## 2020-12-04 DIAGNOSIS — M1712 Unilateral primary osteoarthritis, left knee: Secondary | ICD-10-CM | POA: Diagnosis not present

## 2020-12-04 LAB — BASIC METABOLIC PANEL
Anion gap: 7 (ref 5–15)
BUN: 14 mg/dL (ref 8–23)
CO2: 26 mmol/L (ref 22–32)
Calcium: 9.5 mg/dL (ref 8.9–10.3)
Chloride: 102 mmol/L (ref 98–111)
Creatinine, Ser: 0.78 mg/dL (ref 0.44–1.00)
GFR, Estimated: >60 mL/min (ref >60)
Glucose, Bld: 115 mg/dL — ABNORMAL HIGH (ref 70–99)
Potassium: 5.5 mmol/L — ABNORMAL HIGH (ref 3.5–5.1)
Sodium: 135 mmol/L (ref 135–145)

## 2020-12-04 LAB — CBC
HCT: 36.8 % (ref 36.0–46.0)
Hemoglobin: 12.5 g/dL (ref 12.0–15.0)
MCH: 32 pg (ref 26.0–34.0)
MCHC: 34 g/dL (ref 30.0–36.0)
MCV: 94.1 fL (ref 80.0–100.0)
Platelets: 271 10*3/uL (ref 150–400)
RBC: 3.91 MIL/uL (ref 3.87–5.11)
RDW: 13.1 % (ref 11.5–15.5)
WBC: 13.7 10*3/uL — ABNORMAL HIGH (ref 4.0–10.5)
nRBC: 0 % (ref 0.0–0.2)

## 2020-12-04 LAB — POTASSIUM: Potassium: 4.4 mmol/L (ref 3.5–5.1)

## 2020-12-04 NOTE — Progress Notes (Signed)
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  Pt performed HEP with assist and up to ambulate increased distance in hall.  Reviewed on/doff KI with pt and spouse.   Follow Up Recommendations  Follow surgeon's recommendation for DC plan and follow-up therapies     Equipment Recommendations  None recommended by PT    Recommendations for Other Services       Precautions / Restrictions Precautions Precautions: Fall Restrictions Weight Bearing Restrictions: No Other Position/Activity Restrictions: WBAT    Mobility  Bed Mobility Overal bed mobility: Needs Assistance Bed Mobility: Supine to Sit     Supine to sit: Min guard     General bed mobility comments: cues for sequence and use of R LE to self assist    Transfers Overall transfer level: Needs assistance Equipment used: Rolling walker (2 wheeled) Transfers: Sit to/from Stand Sit to Stand: Min guard         General transfer comment: steady assist with cues for LE management and use of UEs to self assist  Ambulation/Gait Ambulation/Gait assistance: Min guard Gait Distance (Feet): 100 Feet Assistive device: Rolling walker (2 wheeled) Gait Pattern/deviations: Step-to pattern;Shuffle;Trunk flexed;Decreased step length - right;Decreased step length - left Gait velocity: decr   General Gait Details: cues for sequence, posture and position from [employer]             Wheelchair Mobility    Modified Rankin (Stroke Patients Only)       Balance Overall balance assessment: Needs assistance Sitting-balance support: No upper extremity supported;Feet supported Sitting balance-Leahy Scale: Good     Standing balance support: No upper extremity supported Standing balance-Leahy Scale:  Fair                              Cognition Arousal/Alertness: Awake/alert Behavior During Therapy: WFL for tasks assessed/performed Overall Cognitive Status: Within Functional Limits for tasks assessed                                        Exercises Total Joint Exercises Ankle Circles/Pumps: AROM;Both;15 reps;Supine Quad Sets: AROM;Both;Supine;10 reps Heel Slides: AAROM;Left;15 reps;Supine Hip ABduction/ADduction: AAROM;Left;10 reps;Supine Straight Leg Raises: AAROM;Left;Supine;15 reps    General Comments        Pertinent Vitals/Pain Pain Assessment: 0-10 Pain Score: 5  Pain Location: L knee Pain Descriptors / Indicators: Aching;Sore;Grimacing;Guarding Pain Intervention(s): Limited activity within patient's tolerance;Monitored during session;Premedicated before session;Ice applied    Home Living                      Prior Function            PT Goals (current goals can now be found in the care plan section) Acute Rehab PT Goals Patient Stated Goal: Regain IND PT Goal Formulation: With patient Time For Goal Achievement: 12/03/20 Potential to Achieve Goals: Good Progress towards PT goals: Progressing toward goals    Frequency    7X/week      PT Plan Current plan remains appropriate    Co-evaluation  AM-PAC PT "6 Clicks" Mobility   Outcome Measure  Help needed turning from your back to your side while in a flat bed without using bedrails?: A Little Help needed moving from lying on your back to sitting on the side of a flat bed without using bedrails?: A Little Help needed moving to and from a bed to a chair (including a wheelchair)?: A Little Help needed standing up from a chair using your arms (e.g., wheelchair or bedside chair)?: A Little Help needed to walk in hospital room?: A Little Help needed climbing 3-5 steps with a railing? : A Little 6 Click Score: 18    End of Session Equipment  Utilized During Treatment: Gait belt;Left knee immobilizer Activity Tolerance: Patient tolerated treatment well;Patient limited by pain Patient left: in chair;with call bell/phone within reach;with family/visitor present Nurse Communication: Mobility status PT Visit Diagnosis: Difficulty in walking, not elsewhere classified (R26.2);Pain Pain - Right/Left: Left Pain - part of body: Knee     Time: 6948-5462 PT Time Calculation (min) (ACUTE ONLY): 40 min  Charges:  $Gait Training: 8-22 mins $Therapeutic Exercise: 8-22 mins $Therapeutic Activity: 8-22 mins                    Mauro Kaufmann PT Acute Rehabilitation Services Pager [phone] Office [phone]    [facility] 12/04/2020, 12:37 PM

## 2020-12-04 NOTE — Plan of Care (Signed)
  Problem: Pain Managment: Goal: General experience of comfort will improve Outcome: Progressing   Problem: Safety: Goal: Ability to remain free from injury will improve Outcome: Progressing   

## 2020-12-04 NOTE — Progress Notes (Signed)
   Subjective: 2 Days Post-Op Procedure(s) (LRB): TOTAL KNEE ARTHROPLASTY (Left) Patient reports pain as mild.   Patient seen in rounds by Dr. Lequita Halt. Patient is well, and has had no acute complaints or problems. Both pain and nausea are improved this AM. Denies chest pain or SOB. Voiding without difficulty. Plan is to go Home after hospital stay.  Objective: Vital signs in last 24 hours: Temp:  [97.6 F (36.4 C)-98.5 F (36.9 C)] 98.5 F (36.9 C) (07/29 0536) Pulse Rate:  [80-94] 94 (07/29 0536) Resp:  [16-18] 16 (07/29 0536) BP: (142-164)/(79-85) 162/84 (07/29 0536) SpO2:  [96 %-99 %] 99 % (07/29 0536)  Intake/Output from previous day:  Intake/Output Summary (Last 24 hours) at 12/04/2020 0737 Last data filed at 12/04/2020 0153 Gross per 24 hour  Intake 837.5 ml  Output 1750 ml  Net -912.5 ml    Intake/Output this shift: No intake/output data recorded.  Labs: Recent Labs    12/03/20 0345 12/04/20 0333  HGB 12.7 12.5   Recent Labs    12/03/20 0345 12/04/20 0333  WBC 10.4 13.7*  RBC 4.02 3.91  HCT 37.6 36.8  PLT 259 271   Recent Labs    12/03/20 0345 12/04/20 0333  NA 134* 135  K 4.8 5.5*  CL 103 102  CO2 25 26  BUN 13 14  CREATININE 0.61 0.78  GLUCOSE 153* 115*  CALCIUM 8.9 9.5   No results for input(s): LABPT, INR in the last 72 hours.  Exam: General - Patient is Alert and Oriented Extremity - Neurologically intact Neurovascular intact Sensation intact distally Dorsiflexion/Plantar flexion intact Dressing/Incision - clean, dry, no drainage. Bulky dressing removed, aquacel in place. Motor Function - intact, moving foot and toes well on exam.   Past Medical History:  Diagnosis Date   Arthritis    Hypertension    Osteopenia 12/2015   T score -1.3    Assessment/Plan: 2 Days Post-Op Procedure(s) (LRB): TOTAL KNEE ARTHROPLASTY (Left) Principal Problem:   OA (osteoarthritis) of knee Active Problems:   Primary osteoarthritis of left  knee  Estimated body mass index is 28.89 kg/m as calculated from the following:   Height as of this encounter: 5\' 8"  (1.727 m).   Weight as of this encounter: 86.2 kg. Up with therapy D/C IV fluids  DVT Prophylaxis - Xarelto Weight-bearing as tolerated  Plan for discharge today once cleared by physical therapy.  , PA-C Orthopedic Surgery [phone] 12/04/2020, 7:37 AM

## 2020-12-04 NOTE — Progress Notes (Signed)
Discharge package printed and instructions given to pt. Verbalizes understanding. 

## 2020-12-04 NOTE — Plan of Care (Signed)
  Problem: Activity: Goal: Risk for activity intolerance will decrease Outcome: Progressing   Problem: Nutrition: Goal: Adequate nutrition will be maintained Outcome: Progressing   Problem: Coping: Goal: Level of anxiety will decrease Outcome: Progressing   

## 2020-12-04 NOTE — Progress Notes (Signed)
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  Pt up to ambulate increased distance in hall, negotiated stairs and performed therex program with written instruction provided and reviewed.   Follow Up Recommendations  Follow surgeon's recommendation for DC plan and follow-up therapies     Equipment Recommendations  None recommended by PT    Recommendations for Other Services       Precautions / Restrictions Precautions Precautions: Fall Restrictions Weight Bearing Restrictions: No Other Position/Activity Restrictions: WBAT    Mobility  Bed Mobility Overal bed mobility: Needs Assistance Bed Mobility: Supine to Sit     Supine to sit: Min guard     General bed mobility comments: Pt up in chair and requests back to same    Transfers Overall transfer level: Needs assistance Equipment used: Rolling walker (2 wheeled) Transfers: Sit to/from Stand Sit to Stand: Min guard;Supervision         General transfer comment: cues for use of UEs to self assist  Ambulation/Gait Ambulation/Gait assistance: Min guard;Supervision Gait Distance (Feet): 120 Feet Assistive device: Rolling walker (2 wheeled) Gait Pattern/deviations: Step-to pattern;Shuffle;Trunk flexed;Decreased step length - right;Decreased step length - left Gait velocity: decr   General Gait Details: min cues for sequence, posture and position from RW   Stairs Stairs: Yes Stairs assistance: Min assist Stair Management: One rail Left;Step to pattern;Forwards;With crutches Number of Stairs: 5 General stair comments: cues for sequence and foot/crutch placement.  Spouse assisting   Wheelchair Mobility    Modified  Rankin (Stroke Patients Only)       Balance Overall balance assessment: Needs assistance Sitting-balance support: No upper extremity supported;Feet supported Sitting balance-Leahy Scale: Good     Standing balance support: No upper extremity supported Standing balance-Leahy Scale: Fair                              Cognition Arousal/Alertness: Awake/alert Behavior During Therapy: WFL for tasks assessed/performed Overall Cognitive Status: Within Functional Limits for tasks assessed                                        Exercises Total Joint Exercises Ankle Circles/Pumps: AROM;Both;15 reps;Supine Quad Sets: AROM;Both;Supine;10 reps Heel Slides: AAROM;Left;Supine;5 reps Hip ABduction/ADduction: AAROM;Left;10 reps;Supine Straight Leg Raises: AAROM;Left;Supine;10 reps    General Comments        Pertinent Vitals/Pain Pain Assessment: 0-10 Pain Score: 6  Pain Location: L knee Pain Descriptors / Indicators: Aching;Sore;Grimacing;Guarding Pain Intervention(s): Limited activity within patient's tolerance;Monitored during session;Premedicated before session;Ice applied    Home Living                      Prior Function            PT Goals (current goals can now be found in the care plan section) Acute Rehab PT Goals Patient Stated Goal: Regain IND PT Goal Formulation: With patient Time For Goal Achievement: 12/03/20 Potential to Achieve Goals: Good Progress towards PT goals: Progressing toward goals    Frequency  7X/week      PT Plan Current plan remains appropriate    Co-evaluation              AM-PAC PT "6 Clicks" Mobility   Outcome Measure  Help needed turning from your back to your side while in a flat bed without using bedrails?: A Little Help needed moving from lying on your back to sitting on the side of a flat bed without using bedrails?: A Little Help needed moving to and from a bed to a chair (including  a wheelchair)?: A Little Help needed standing up from a chair using your arms (e.g., wheelchair or bedside chair)?: A Little Help needed to walk in hospital room?: A Little Help needed climbing 3-5 steps with a railing? : A Little 6 Click Score: 18    End of Session Equipment Utilized During Treatment: Gait belt;Left knee immobilizer Activity Tolerance: Patient tolerated treatment well;Patient limited by pain Patient left: in chair;with call bell/phone within reach;with family/visitor present Nurse Communication: Mobility status PT Visit Diagnosis: Difficulty in walking, not elsewhere classified (R26.2);Pain Pain - Right/Left: Left Pain - part of body: Knee     Time: 9562-1308 PT Time Calculation (min) (ACUTE ONLY): 33 min  Charges:  $Gait Training: 8-22 mins $Therapeutic Exercise: 8-22 mins $Therapeutic Activity: 8-22 mins                     Mauro Kaufmann PT Acute Rehabilitation Services Pager [phone] Office [phone]    Sydnei Ohaver 12/04/2020, 2:15 PM

## 2020-12-07 NOTE — Discharge Summary (Signed)
Physician Discharge Summary   Patient ID: Deborah Hampton MRN: [id number] DOB/AGE: [birth year] 65 y.o.  Admit date: 12/02/2020 Discharge date: 12/04/2020  Primary Diagnosis: Osteoarthritis, left knee   Admission Diagnoses:  Past Medical History:  Diagnosis Date   Arthritis    Hypertension    Osteopenia 12/2015   T score -1.3   Discharge Diagnoses:   Principal Problem:   OA (osteoarthritis) of knee Active Problems:   Primary osteoarthritis of left knee  Estimated body mass index is 28.89 kg/m as calculated from the following:   Height as of this encounter:  (1.727 m).   Weight as of this encounter: 86.2 kg.  Procedure:  Procedure(s) (LRB): TOTAL KNEE ARTHROPLASTY (Left)   Consults: None  HPI: Deborah Hampton is a 65 y.o. year old female with end stage OA of her left knee with progressively worsening pain and dysfunction. She has constant pain, with activity and at rest and significant functional deficits with difficulties even with ADLs. She has had extensive non-op management including analgesics, injections of cortisone and viscosupplements, and home exercise program, but remains in significant pain with significant dysfunction. Radiographs show bone on bone arthritis medial and patellofemoral . She presents now for left Total Knee Arthroplasty.  Laboratory Data: Admission on 12/02/2020, Discharged on 12/04/2020  Component Date Value Ref Range Status   ABO/RH(D) 12/02/2020    Final                   Value:O POS Performed at [facility], [address], [place], [place] [address]    WBC 12/03/2020 10.4  4.0 - 10.5 K/uL Final   RBC 12/03/2020 4.02  3.87 - 5.11 MIL/uL Final   Hemoglobin 12/03/2020 12.7  12.0 - 15.0 g/dL Final   HCT 19/14/7829 37.6  36.0 - 46.0 % Final   MCV 12/03/2020 93.5  80.0 - 100.0 fL Final   MCH 12/03/2020 31.6  26.0 - 34.0 pg Final   MCHC 12/03/2020 33.8  30.0 - 36.0 g/dL Final   RDW 56/21/3086 12.6  11.5 - 15.5 %  Final   Platelets 12/03/2020 259  150 - 400 K/uL Final   nRBC 12/03/2020 0.0  0.0 - 0.2 % Final   Performed at [facility], [address], [place], [place] [address]   Sodium 12/03/2020 134 (A) 135 - 145 mmol/L Final   Potassium 12/03/2020 4.8  3.5 - 5.1 mmol/L Final   Chloride 12/03/2020 103  98 - 111 mmol/L Final   CO2 12/03/2020 25  22 - 32 mmol/L Final   Glucose, Bld 12/03/2020 153 (A) 70 - 99 mg/dL Final   Glucose reference range applies only to samples taken after fasting for at least 8 hours.   BUN 12/03/2020 13  8 - 23 mg/dL Final   Creatinine, Ser 12/03/2020 0.61  0.44 - 1.00 mg/dL Final   Calcium 96/29/5284 8.9  8.9 - 10.3 mg/dL Final   GFR, Estimated 12/03/2020 >60  >60 mL/min Final   Comment: (NOTE) Calculated using the CKD-EPI Creatinine Equation (2021)    Anion gap 12/03/2020 6  5 - 15 Final   Performed at [facility], [address], [place], [place] [address]   WBC 12/04/2020 13.7 (A) 4.0 - 10.5 K/uL Final   RBC 12/04/2020 3.91  3.87 - 5.11 MIL/uL Final   Hemoglobin 12/04/2020 12.5  12.0 - 15.0 g/dL Final   HCT 05/11/7251 36.8  36.0 - 46.0 % Final   MCV 12/04/2020  94.1  80.0 - 100.0 fL Final   MCH 12/04/2020 32.0  26.0 - 34.0 pg Final   MCHC 12/04/2020 34.0  30.0 - 36.0 g/dL Final   RDW 43/15/4008 13.1  11.5 - 15.5 % Final   Platelets 12/04/2020 271  150 - 400 K/uL Final   nRBC 12/04/2020 0.0  0.0 - 0.2 % Final   Performed at [facility], [address], [place], [place] [address]   Sodium 12/04/2020 135  135 - 145 mmol/L Final   Potassium 12/04/2020 5.5 (A) 3.5 - 5.1 mmol/L Final   Chloride 12/04/2020 102  98 - 111 mmol/L Final   CO2 12/04/2020 26  22 - 32 mmol/L Final   Glucose, Bld 12/04/2020 115 (A) 70 - 99 mg/dL Final   Glucose reference range applies only to samples taken after fasting for at least 8 hours.   BUN 12/04/2020 14  8 - 23 mg/dL Final   Creatinine, Ser 12/04/2020 0.78  0.44 - 1.00 mg/dL  Final   Calcium 50/93/2671 9.5  8.9 - 10.3 mg/dL Final   GFR, Estimated 12/04/2020 >60  >60 mL/min Final   Comment: (NOTE) Calculated using the CKD-EPI Creatinine Equation (2021)    Anion gap 12/04/2020 7  5 - 15 Final   Performed at [facility], [address], [place], [place] [address]   Potassium 12/04/2020 4.4  3.5 - 5.1 mmol/L Final   Comment: DELTA CHECK NOTED Performed at [facility], [address], [place], [place] [address]   Hospital Outpatient Visit on 11/30/2020  Component Date Value Ref Range Status   SARS Coronavirus 2 11/30/2020 NEGATIVE  NEGATIVE Final   Comment: (NOTE) SARS-CoV-2 target nucleic acids are NOT DETECTED.  The SARS-CoV-2 RNA is generally detectable in upper and lower respiratory specimens during the acute phase of infection. Negative results do not preclude SARS-CoV-2 infection, do not rule out co-infections with other pathogens, and should not be used as the sole basis for treatment or other patient management decisions. Negative results must be combined with clinical observations, patient history, and epidemiological information. The expected result is Negative.  Fact Sheet for Patients: HairSlick.no  Fact Sheet for Healthcare Providers: quierodirigir.com  This test is not yet approved or cleared by the [place] FDA and  has been authorized for detection and/or diagnosis of SARS-CoV-2 by FDA under an Emergency Use Authorization (EUA). This EUA will remain  in effect (meaning this test can be used) for the duration of the COVID-19 declaration under Se                          ction 564(b)(1) of the Act, 21 U.S.C. section 360bbb-3(b)(1), unless the authorization is terminated or revoked sooner.  Performed at [facility], [address], [place], [place] [address]   Hospital Outpatient Visit on 11/25/2020  Component Date Value Ref Range  Status   WBC 11/25/2020 5.8  4.0 - 10.5 K/uL Final   RBC 11/25/2020 4.63  3.87 - 5.11 MIL/uL Final   Hemoglobin 11/25/2020 14.7  12.0 - 15.0 g/dL Final   HCT 39/76/7341 43.2  36.0 - 46.0 % Final   MCV 11/25/2020 93.3  80.0 - 100.0 fL Final   MCH 11/25/2020 31.7  26.0 - 34.0 pg Final   MCHC 11/25/2020 34.0  30.0 - 36.0 g/dL Final   RDW 93/79/0240 13.0  11.5 - 15.5 % Final   Platelets 11/25/2020 318  150 -  400 K/uL Final   nRBC 11/25/2020 0.0  0.0 - 0.2 % Final   Performed at [facility], [address] [place], [place] [address]   Sodium 11/25/2020 139  135 - 145 mmol/L Final   Potassium 11/25/2020 4.1  3.5 - 5.1 mmol/L Final   Chloride 11/25/2020 107  98 - 111 mmol/L Final   CO2 11/25/2020 25  22 - 32 mmol/L Final   Glucose, Bld 11/25/2020 95  70 - 99 mg/dL Final   Glucose reference range applies only to samples taken after fasting for at least 8 hours.   BUN 11/25/2020 14  8 - 23 mg/dL Final   Creatinine, Ser 11/25/2020 0.70  0.44 - 1.00 mg/dL Final   Calcium 59/93/5701 9.6  8.9 - 10.3 mg/dL Final   Total Protein 77/93/9030 7.2  6.5 - 8.1 g/dL Final   Albumin 01/29/3006 4.3  3.5 - 5.0 g/dL Final   AST 62/26/3335 17  15 - 41 U/L Final   ALT 11/25/2020 17  0 - 44 U/L Final   Alkaline Phosphatase 11/25/2020 65  38 - 126 U/L Final   Total Bilirubin 11/25/2020 1.3 (A) 0.3 - 1.2 mg/dL Final   GFR, Estimated 11/25/2020 >60  >60 mL/min Final   Comment: (NOTE) Calculated using the CKD-EPI Creatinine Equation (2021)    Anion gap 11/25/2020 7  5 - 15 Final   Performed at [facility], [address], [place], [place] [address]   Prothrombin Time 11/25/2020 12.5  11.4 - 15.2 seconds Final   INR 11/25/2020 0.9  0.8 - 1.2 Final   Comment: (NOTE) INR goal varies based on device and disease states. Performed at [facility], [address], [place], [place] [address]    aPTT 11/25/2020 33  24 - 36 seconds Final   Performed at [facility], [address], [place], [place] [address]   ABO/RH(D) 11/25/2020 O POS   Final   Antibody Screen 11/25/2020 NEG   Final   Sample Expiration 11/25/2020 12/05/2020,2359   Final   Extend sample reason 11/25/2020    Final                   Value:NO TRANSFUSIONS OR PREGNANCY IN THE PAST 3 MONTHS Performed at [facility], [address], [place], [place] [address]    MRSA, PCR 11/25/2020 NEGATIVE  NEGATIVE Final   Staphylococcus aureus 11/25/2020 NEGATIVE  NEGATIVE Final   Comment: (NOTE) The Xpert SA Assay (FDA approved for NASAL specimens in patients 83 years of age and older), is one component of a comprehensive surveillance program. It is not intended to diagnose infection nor to guide or monitor treatment. Performed at [facility], [address], [place], [place] [address]      X-Rays:No results found.  EKG: Orders placed or performed during the hospital encounter of 11/25/20   EKG 12 lead per protocol   EKG 12 lead per protocol     Hospital Course: Deborah Hampton is a 65 y.o. who was admitted to [facility]. They were brought to the operating room on 12/02/2020 and underwent Procedure(s): TOTAL KNEE ARTHROPLASTY.  Patient tolerated the procedure well and was later transferred to the recovery room and then to the orthopaedic floor for postoperative care. They were given PO and IV analgesics for pain control following their surgery. They were given 24 hours of postoperative antibiotics of  Anti-infectives (From admission, onward)  Start     Dose/Rate Route Frequency Ordered Stop   12/02/20 1530  ceFAZolin (ANCEF) 2 g in sodium chloride 0.9 % 100 mL IVPB        2 g 200 mL/hr over 30 Minutes Intravenous Every 6 hours 12/02/20 1408 12/02/20 2151   12/02/20 0700  ceFAZolin (ANCEF) 2 g in sodium chloride 0.9 % 100 mL IVPB        2 g 200 mL/hr over 30 Minutes Intravenous On call to O.R. 12/02/20 0654  12/02/20 1004      and started on DVT prophylaxis in the form of Xarelto.   PT and OT were ordered for total joint protocol. Discharge planning consulted to help with postop disposition and equipment needs. Patient had a decent night on the evening of surgery. Had issues with pain control on the evening of surgery, PO and IV pain medications were switched with dilaudid with relief. They started to get up OOB with therapy on POD #1. Continued to work with therapy into POD #2. Pt was seen during rounds on day two and was ready to go home pending progress with therapy. Dressing was changed and the incision was clean, dry, and intact. Pt worked with therapy for two additional sessions and was meeting their goals. She was discharged to home later that day in stable condition.  Diet: Regular diet Activity: WBAT Follow-up: in 2 weeks Disposition: Home with OPPT Discharged Condition: stable   Discharge Instructions     Call MD / Call 911   Complete by: As directed    If you experience chest pain or shortness of breath, CALL 911 and be transported to the hospital emergency room.  If you develope a fever above 101 F, pus (white drainage) or increased drainage or redness at the wound, or calf pain, call your surgeon's office.   Change dressing   Complete by: As directed    You may remove the bulky bandage (ACE wrap and gauze) two days after surgery. You will have an adhesive waterproof bandage underneath. Leave this in place until your first follow-up appointment.   Constipation Prevention   Complete by: As directed    Drink plenty of fluids.  Prune juice may be helpful.  You may use a stool softener, such as Colace (over the counter) 100 mg twice a day.  Use MiraLax (over the counter) for constipation as needed.   Diet - low sodium heart healthy   Complete by: As directed    Do not put a pillow under the knee. Place it under the heel.   Complete by: As directed    Driving restrictions   Complete by:  As directed    No driving for two weeks   Post-operative opioid taper instructions:   Complete by: As directed    POST-OPERATIVE OPIOID TAPER INSTRUCTIONS: It is important to wean off of your opioid medication as soon as possible. If you do not need pain medication after your surgery it is ok to stop day one. Opioids include: Codeine, Hydrocodone(Norco, Vicodin), Oxycodone(Percocet, oxycontin) and hydromorphone amongst others.  Long term and even short term use of opiods can cause: Increased pain response Dependence Constipation Depression Respiratory depression And more.  Withdrawal symptoms can include Flu like symptoms Nausea, vomiting And more Techniques to manage these symptoms Hydrate well Eat regular healthy meals Stay active Use relaxation techniques(deep breathing, meditating, yoga) Do Not substitute Alcohol to help with tapering If you have been on opioids for less than two weeks and  do not have pain than it is ok to stop all together.  Plan to wean off of opioids This plan should start within one week post op of your joint replacement. Maintain the same interval or time between taking each dose and first decrease the dose.  Cut the total daily intake of opioids by one tablet each day Next start to increase the time between doses. The last dose that should be eliminated is the evening dose.      TED hose   Complete by: As directed    Use stockings (TED hose) for three weeks on both leg(s).  You may remove them at night for sleeping.   Weight bearing as tolerated   Complete by: As directed       Allergies as of 12/04/2020       Reactions   Butalbital-apap-caffeine Other (See Comments)   insomnia   Amoxicillin-pot Clavulanate Diarrhea   Lisinopril    Cough   Nitrofurantoin Monohyd Macro Diarrhea   Cefuroxime Nausea Only   Clarithromycin Nausea Only, Other (See Comments)   GI Upset        Medication List     STOP taking these medications    COQ10  PO   multivitamin with minerals Tabs tablet   VITAMIN D3 PO   VITAMIN E PO       TAKE these medications    cyclobenzaprine 10 MG tablet Commonly known as: FLEXERIL Take 1 tablet (10 mg total) by mouth 3 (three) times daily as needed for muscle spasms.   gabapentin 300 MG capsule Commonly known as: NEURONTIN Take a 300 mg capsule three times a day for two weeks following surgery.Then take a 300 mg capsule two times a day for two weeks. Then take a 300 mg capsule once a day for two weeks. Then discontinue.   HYDROmorphone 2 MG tablet Commonly known as: DILAUDID Take 1-2 tablets (2-4 mg total) by mouth every 6 (six) hours as needed for severe pain.   losartan 50 MG tablet Commonly known as: COZAAR Take 50 mg by mouth in the morning.   rivaroxaban 10 MG Tabs tablet Commonly known as: XARELTO Take 1 tablet (10 mg total) by mouth daily with breakfast for 20 days.   traMADol 50 MG tablet Commonly known as: ULTRAM Take 1-2 tablets (50-100 mg total) by mouth every 6 (six) hours as needed for moderate pain.               Discharge Care Instructions  (From admission, onward)           Start     Ordered   12/03/20 0000  Weight bearing as tolerated        12/03/20 0730   12/03/20 0000  Change dressing       Comments: You may remove the bulky bandage (ACE wrap and gauze) two days after surgery. You will have an adhesive waterproof bandage underneath. Leave this in place until your first follow-up appointment.   12/03/20 0730            Follow-up Information     Ollen GrossAluisio, Frank, MD Follow up.   Specialty: Orthopedic Surgery Why: You have an appointment scheduled on 12/15/20 at 1:30 pm Contact information: [address] [place] [id number] [phone]                 Signed: Arther AbbottKristie Aeden Matranga, PA-C Orthopedic Surgery 12/07/2020, 8:17 AM

## 2021-08-06 ENCOUNTER — Ambulatory Visit (INDEPENDENT_AMBULATORY_CARE_PROVIDER_SITE_OTHER): Admitting: Radiology

## 2021-08-06 ENCOUNTER — Encounter: Payer: Self-pay | Admitting: Radiology

## 2021-08-06 VITALS — BP 142/80

## 2021-08-06 DIAGNOSIS — N760 Acute vaginitis: Secondary | ICD-10-CM | POA: Diagnosis not present

## 2021-08-06 LAB — WET PREP FOR TRICH, YEAST, CLUE

## 2021-08-06 MED ORDER — FLUCONAZOLE 150 MG PO TABS: 150.0000 mg | ORAL_TABLET | ORAL | 0 refills | Status: DC

## 2021-08-06 NOTE — Progress Notes (Signed)
? ? ? ? ?  Subjective: Deborah Hampton is a 66 y.o. female who complains of vaginal discharge with odor, slight itching (x's 1 week) ? ? ?Review of Systems  ? ?Objective: ? ?-Vulva: without lesions or discharge ?-Vagina: thick, white discharge present, wet prep obtained ?-Cervix: absent ?-Perineum: no lesions ?-Uterus: absent ?-Adnexa: no masses or tenderness ? ? ?Microscopic wet-mount exam shows excessive bacteria, hyphae.  ? ?Chaperone offered and declined. ? ?Assessment:/Plan:  ? ?1. Acute vaginitis ?Diflucan every 3 days x 3 at pt request ?- WET PREP FOR TRICH, YEAST, CLUE ?  ? ?Will contact patient with results of testing completed today. Avoid intercourse until symptoms are resolved. Safe sex encouraged. Avoid the use of soaps or perfumed products in the peri area. Avoid tub baths and sitting in sweaty or wet clothing for prolonged periods of time.   ?

## 2021-10-31 IMAGING — NM NM BONE 3 PHASE
2 series · 12 of 12 positions shown · non-contrast
Comparison: No relevant prior studies available for direct
comparison at this institution

CLINICAL DATA: Right knee pain and swelling for 3 years, right knee
arthroplasty 09/30/2016

EXAM:
NUCLEAR MEDICINE 3-PHASE BONE SCAN
TECHNIQUE: Radionuclide angiographic images, immediate static blood pool
images, and 3-hour delayed static images were obtained of the
bilateral knees after intravenous injection of radiopharmaceutical.
RADIOPHARMACEUTICALS:  21.4 mCi Zc-YYm MDP IV

[Series 1: flow · 2.07mm/px · 6 of 48 frames shown (1 of 2)]
[frame 5/48]
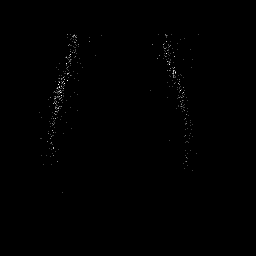
[frame 13/48  full-range]
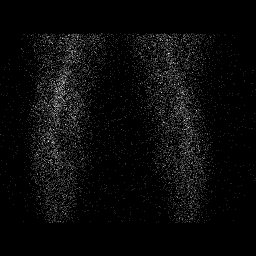
[frame 21/48  full-range]
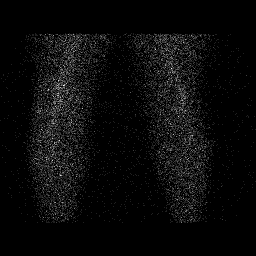
[frame 29/48  full-range]
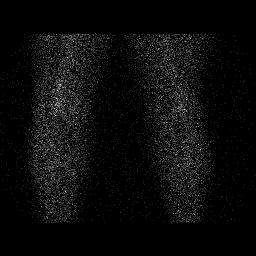
[frame 37/48  full-range]
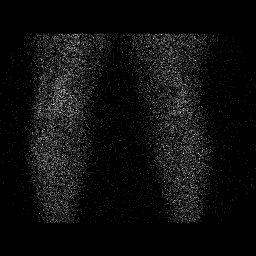
[frame 45/48  full-range]
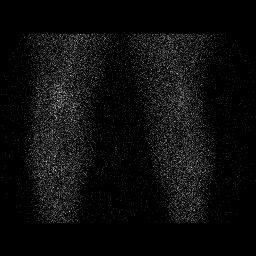

[Series 1: flow · 2.07mm/px · 6 of 48 frames shown (2 of 2)]
[frame 5/48]
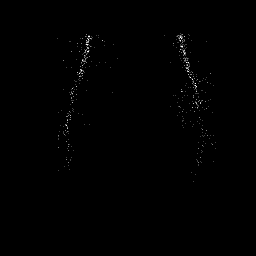
[frame 13/48  full-range]
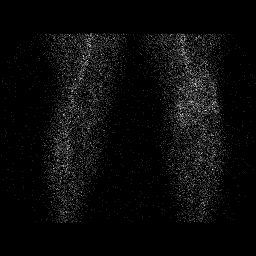
[frame 21/48  full-range]
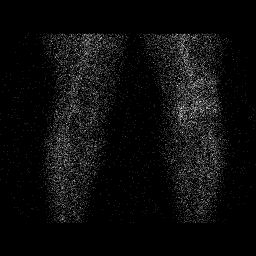
[frame 29/48  full-range]
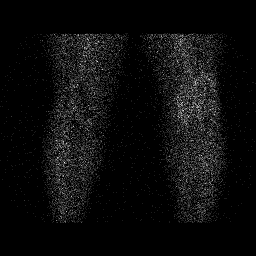
[frame 37/48  full-range]
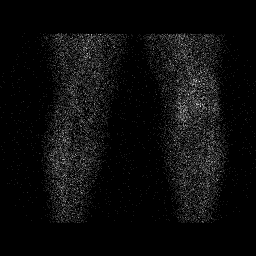
[frame 45/48  full-range]
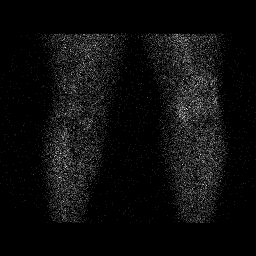

[12 of 12 positions shown; findings below may reference images not displayed]

FINDINGS: Vascular phase: Symmetrical radiotracer activity during the vascular
phase.

Blood pool phase: No significant radiotracer deposition within the
right knee.

There is moderate radiotracer uptake throughout the left knee,
greatest in the medial compartment.

Delayed phase: There is moderate diffuse uptake throughout the left
knee, most pronounced along the tibial aspect of the medial
compartment. Findings are most suggestive of moderate to severe
osteoarthritis.

Mild radiotracer activity along the medial compartment of the right
knee likely represents medial compartment right knee
hemiarthroplasty. Degree of activity suggest postsurgical change.
IMPRESSION: 1. Likely postsurgical activity involving the medial compartment of
the right knee related to medial compartment hemiarthroplasty. I do
not have any radiographs for direct comparison.
2. Moderate activity throughout the left knee, greatest in the
medial compartment, consistent with significant osteoarthritis.

## 2022-02-23 ENCOUNTER — Telehealth: Payer: Self-pay | Admitting: *Deleted

## 2022-02-23 ENCOUNTER — Ambulatory Visit (INDEPENDENT_AMBULATORY_CARE_PROVIDER_SITE_OTHER): Payer: Medicare Other | Admitting: Radiology

## 2022-02-23 VITALS — BP 114/74

## 2022-02-23 DIAGNOSIS — N6322 Unspecified lump in the left breast, upper inner quadrant: Secondary | ICD-10-CM | POA: Diagnosis not present

## 2022-02-23 DIAGNOSIS — N760 Acute vaginitis: Secondary | ICD-10-CM | POA: Diagnosis not present

## 2022-02-23 DIAGNOSIS — B3731 Acute candidiasis of vulva and vagina: Secondary | ICD-10-CM | POA: Diagnosis not present

## 2022-02-23 LAB — WET PREP FOR TRICH, YEAST, CLUE

## 2022-02-23 MED ORDER — FLUCONAZOLE 150 MG PO TABS: 150.0000 mg | ORAL_TABLET | ORAL | 0 refills | Status: DC

## 2022-02-23 NOTE — Progress Notes (Signed)
      Subjective: Deborah Hampton is a 66 y.o. female who complains of vaginal white vaginal discharge, itching, slight odor.  Patient took Cipro for a UTI last week.   Also, complains of left breast tenderness x's few weeks with a small mass at [place]. Scheduled for breast lift and tummy tuck 03/11/22.  Review of Systems  All other systems reviewed and are negative.   Past Medical History:  Diagnosis Date   Arthritis    Hypertension    Osteopenia 12/2015   T score -1.3      Objective:  Today's Vitals   02/23/22 1112  BP: 114/74   There is no height or weight on file to calculate BMI.   -General: no acute distress -Vulva: without lesions or discharge -Vagina: discharge present, aptima swab and wet prep obtained -Cervix: absent -Perineum: no lesions  Breasts: right breast normal without mass, skin or nipple changes or axillary nodes, abnormal mass palpable left breast 10 oclock, pea sized.   Microscopic wet-mount exam shows hyphae.   Chaperone offered and declined.  Assessment:/Plan:   1. Acute vaginitis +yeast, rx sent for fluconazole  - WET PREP FOR [place], YEAST, CLUE  2. Mass of upper inner quadrant of left breast Dx mammo ordered   Avoid intercourse until symptoms are resolved. Safe sex encouraged. Avoid the use of soaps or perfumed products in the peri area. Avoid tub baths and sitting in sweaty or wet clothing for prolonged periods of time.

## 2022-02-23 NOTE — Telephone Encounter (Signed)
Patient called back and said [facility] will be fine for diagnostic imaging. Her main concern is getting it completed before 03/11/22.

## 2022-02-23 NOTE — Telephone Encounter (Signed)
-----   Message from Kerry Dory, NP sent at 02/23/2022 11:25 AM EDT ----- Regarding: Dx mammo Please schedule diagnostic mammogram. Pea sized mass left breast 10 olclock, tender, fixed. Having breast lift scheduled 11/3.

## 2022-02-23 NOTE — Telephone Encounter (Signed)
Left message for patient to call to discuss her order going to [facility] in [place] her previous imaging was done there.

## 2022-02-24 NOTE — Telephone Encounter (Signed)
Patient informed she can call [facility] to schedule the below, order faxed.

## 2022-02-24 NOTE — Telephone Encounter (Signed)
Patient informed me she is scheduled on 03/01/22 @ 10:45 am

## 2022-11-30 ENCOUNTER — Encounter: Payer: Self-pay | Admitting: Obstetrics and Gynecology

## 2022-11-30 ENCOUNTER — Ambulatory Visit (INDEPENDENT_AMBULATORY_CARE_PROVIDER_SITE_OTHER): Payer: Medicare PPO | Admitting: Obstetrics and Gynecology

## 2022-11-30 VITALS — BP 122/80 | HR 65 | Wt 165.0 lb

## 2022-11-30 DIAGNOSIS — N898 Other specified noninflammatory disorders of vagina: Secondary | ICD-10-CM | POA: Diagnosis not present

## 2022-11-30 DIAGNOSIS — M549 Dorsalgia, unspecified: Secondary | ICD-10-CM | POA: Diagnosis not present

## 2022-11-30 DIAGNOSIS — R350 Frequency of micturition: Secondary | ICD-10-CM

## 2022-11-30 LAB — URINALYSIS, COMPLETE W/RFL CULTURE
Bilirubin Urine: NEGATIVE
Hgb urine dipstick: NEGATIVE
RBC / HPF: NONE SEEN /HPF (ref 0–2)
pH: 5.5 (ref 5.0–8.0)

## 2022-11-30 LAB — WET PREP FOR TRICH, YEAST, CLUE

## 2022-11-30 LAB — CULTURE INDICATED

## 2022-11-30 MED ORDER — SULFAMETHOXAZOLE-TRIMETHOPRIM 800-160 MG PO TABS
1.0000 | ORAL_TABLET | Freq: Two times a day (BID) | ORAL | 0 refills | Status: AC
Start: 2022-11-30 — End: ?
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
## 2022-11-30 NOTE — Patient Instructions (Signed)
Urinary Tract Infection, Adult  A urinary tract infection (UTI) is an infection of any part of the urinary tract. The urinary tract includes the kidneys, ureters, bladder, and urethra. These organs make, store, and get rid of urine in the body. An upper UTI affects the ureters and kidneys. A lower UTI affects the bladder and urethra. What are the causes? Most urinary tract infections are caused by bacteria in your genital area around your urethra, where urine leaves your body. These bacteria grow and cause inflammation of your urinary tract. What increases the risk? You are more likely to develop this condition if: You have a urinary catheter that stays in place. You are not able to control when you urinate or have a bowel movement (incontinence). You are female and you: Use a spermicide or diaphragm for birth control. Have low estrogen levels. Are pregnant. You have certain genes that increase your risk. You are sexually active. You take antibiotic medicines. You have a condition that causes your flow of urine to slow down, such as: An enlarged prostate, if you are female. Blockage in your urethra. A kidney stone. A nerve condition that affects your bladder control (neurogenic bladder). Not getting enough to drink, or not urinating often. You have certain medical conditions, such as: Diabetes. A weak disease-fighting system (immunesystem). Sickle cell disease. Gout. Spinal cord injury. What are the signs or symptoms? Symptoms of this condition include: Needing to urinate right away (urgency). Frequent urination. This may include small amounts of urine each time you urinate. Pain or burning with urination. Blood in the urine. Urine that smells bad or unusual. Trouble urinating. Cloudy urine. Vaginal discharge, if you are female. Pain in the abdomen or the lower back. You may also have: Vomiting or a decreased appetite. Confusion. Irritability or tiredness. A fever or  chills. Diarrhea. The first symptom in older adults may be confusion. In some cases, they may not have any symptoms until the infection has worsened. How is this diagnosed? This condition is diagnosed based on your medical history and a physical exam. You may also have other tests, including: Urine tests. Blood tests. Tests for STIs (sexually transmitted infections). If you have had more than one UTI, a cystoscopy or imaging studies may be done to determine the cause of the infections. How is this treated? Treatment for this condition includes: Antibiotic medicine. Over-the-counter medicines to treat discomfort. Drinking enough water to stay hydrated. If you have frequent infections or have other conditions such as a kidney stone, you may need to see a health care provider who specializes in the urinary tract (urologist). In rare cases, urinary tract infections can cause sepsis. Sepsis is a life-threatening condition that occurs when the body responds to an infection. Sepsis is treated in the hospital with IV antibiotics, fluids, and other medicines. Follow these instructions at home:  Medicines Take over-the-counter and prescription medicines only as told by your health care provider. If you were prescribed an antibiotic medicine, take it as told by your health care provider. Do not stop using the antibiotic even if you start to feel better. General instructions Make sure you: Empty your bladder often and completely. Do not hold urine for long periods of time. Empty your bladder after sex. Wipe from front to back after urinating or having a bowel movement if you are female. Use each tissue only one time when you wipe. Drink enough fluid to keep your urine pale yellow. Keep all follow-up visits. This is important. Contact a health   care provider if: Your symptoms do not get better after 1-2 days. Your symptoms go away and then return. Get help right away if: You have severe pain in  your back or your lower abdomen. You have a fever or chills. You have nausea or vomiting. Summary A urinary tract infection (UTI) is an infection of any part of the urinary tract, which includes the kidneys, ureters, bladder, and urethra. Most urinary tract infections are caused by bacteria in your genital area. Treatment for this condition often includes antibiotic medicines. If you were prescribed an antibiotic medicine, take it as told by your health care provider. Do not stop using the antibiotic even if you start to feel better. Keep all follow-up visits. This is important. This information is not intended to replace advice given to you by your health care provider. Make sure you discuss any questions you have with your health care provider. Document Revised: 12/01/2019 Document Reviewed: 12/06/2019 Elsevier Patient Education  2024 [employer]  

## 2022-12-01 LAB — URINE CULTURE: SPECIMEN QUALITY:: ADEQUATE

## 2022-12-01 LAB — URINALYSIS, COMPLETE W/RFL CULTURE
Glucose, UA: NEGATIVE
Hyaline Cast: NONE SEEN /LPF
Leukocyte Esterase: NEGATIVE
Nitrites, Initial: NEGATIVE
Protein, ur: NEGATIVE
Specific Gravity, Urine: 1.025 (ref 1.001–1.035)

## 2023-06-05 ENCOUNTER — Ambulatory Visit: Payer: Medicare (Managed Care) | Admitting: Nurse Practitioner

## 2023-09-28 ENCOUNTER — Ambulatory Visit: Payer: TRICARE For Life (TFL) | Admitting: Obstetrics and Gynecology
